# Patient Record
Sex: Male | Born: 1990 | Race: White | Hispanic: No | Marital: Single | Smoking: Current every day smoker
Health system: Southern US, Community
[De-identification: ages and names within clinical notes are randomized; demographics above are authoritative.]

## PROBLEM LIST (undated history)

## (undated) DIAGNOSIS — F209 Schizophrenia, unspecified: Secondary | ICD-10-CM

## (undated) DIAGNOSIS — F3181 Bipolar II disorder: Secondary | ICD-10-CM

---
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
## 2013-09-18 ENCOUNTER — Emergency Department (HOSPITAL_COMMUNITY)
Admission: EM | Admit: 2013-09-18 | Discharge: 2013-09-19 | Disposition: A | Discharge status: Other Acute Inpatient Hospital | Payer: Medicaid Other | Attending: Emergency Medicine | Admitting: Emergency Medicine

## 2013-09-18 ENCOUNTER — Encounter (HOSPITAL_COMMUNITY): Payer: Self-pay | Admitting: Emergency Medicine

## 2013-09-18 DIAGNOSIS — F1210 Cannabis abuse, uncomplicated: Secondary | ICD-10-CM | POA: Insufficient documentation

## 2013-09-18 DIAGNOSIS — R45851 Suicidal ideations: Secondary | ICD-10-CM | POA: Insufficient documentation

## 2013-09-18 DIAGNOSIS — R4589 Other symptoms and signs involving emotional state: Secondary | ICD-10-CM

## 2013-09-18 DIAGNOSIS — F259 Schizoaffective disorder, unspecified: Secondary | ICD-10-CM

## 2013-09-18 DIAGNOSIS — F17200 Nicotine dependence, unspecified, uncomplicated: Secondary | ICD-10-CM | POA: Insufficient documentation

## 2013-09-18 DIAGNOSIS — F23 Brief psychotic disorder: Secondary | ICD-10-CM

## 2013-09-18 DIAGNOSIS — F29 Unspecified psychosis not due to a substance or known physiological condition: Secondary | ICD-10-CM | POA: Insufficient documentation

## 2013-09-18 DIAGNOSIS — Z8659 Personal history of other mental and behavioral disorders: Secondary | ICD-10-CM | POA: Insufficient documentation

## 2013-09-18 DIAGNOSIS — IMO0002 Reserved for concepts with insufficient information to code with codable children: Secondary | ICD-10-CM | POA: Insufficient documentation

## 2013-09-18 HISTORY — DX: Bipolar II disorder: F31.81

## 2013-09-18 HISTORY — DX: Schizophrenia, unspecified: F20.9

## 2013-09-18 LAB — CBC
HEMATOCRIT: 45.9 % (ref 39.0–52.0)
Hemoglobin: 15.8 g/dL (ref 13.0–17.0)
MCH: 29.4 pg (ref 26.0–34.0)
MCHC: 34.4 g/dL (ref 30.0–36.0)
MCV: 85.5 fL (ref 78.0–100.0)
Platelets: 285 10*3/uL (ref 150–400)
RBC: 5.37 MIL/uL (ref 4.22–5.81)
RDW: 14.6 % (ref 11.5–15.5)
WBC: 9.8 10*3/uL (ref 4.0–10.5)

## 2013-09-18 LAB — COMPREHENSIVE METABOLIC PANEL
ALK PHOS: 77 U/L (ref 39–117)
ALT: 120 U/L — ABNORMAL HIGH (ref 0–53)
ANION GAP: 14 (ref 5–15)
AST: 32 U/L (ref 0–37)
Albumin: 4.2 g/dL (ref 3.5–5.2)
BUN: 12 mg/dL (ref 6–23)
CHLORIDE: 102 meq/L (ref 96–112)
CO2: 25 mEq/L (ref 19–32)
Calcium: 9.9 mg/dL (ref 8.4–10.5)
Creatinine, Ser: 1.07 mg/dL (ref 0.50–1.35)
GFR calc non Af Amer: >90 mL/min (ref >90)
GLUCOSE: 85 mg/dL (ref 70–99)
POTASSIUM: 4.4 meq/L (ref 3.7–5.3)
Sodium: 141 mEq/L (ref 137–147)
Total Bilirubin: 0.4 mg/dL (ref 0.3–1.2)
Total Protein: 7.9 g/dL (ref 6.0–8.3)

## 2013-09-18 LAB — ETHANOL: Alcohol, Ethyl (B): <11 mg/dL (ref 0–11)

## 2013-09-18 LAB — ACETAMINOPHEN LEVEL

## 2013-09-18 LAB — SALICYLATE LEVEL: Salicylate Lvl: <2 mg/dL — ABNORMAL LOW (ref 2.8–20.0)

## 2013-09-18 MED ORDER — ACETAMINOPHEN 325 MG PO TABS: 650.0000 mg | ORAL_TABLET | ORAL | Status: DC | PRN

## 2013-09-18 MED ORDER — ALUM & MAG HYDROXIDE-SIMETH 200-200-20 MG/5ML PO SUSP: 30.0000 mL | ORAL | Status: DC | PRN

## 2013-09-18 MED ORDER — ZOLPIDEM TARTRATE 5 MG PO TABS: 5.0000 mg | ORAL_TABLET | Freq: Every evening | ORAL | Status: DC | PRN

## 2013-09-18 MED ORDER — ONDANSETRON HCL 4 MG PO TABS: 4.0000 mg | ORAL_TABLET | Freq: Three times a day (TID) | ORAL | Status: DC | PRN

## 2013-09-18 MED ORDER — NICOTINE 21 MG/24HR TD PT24
21.0000 mg | MEDICATED_PATCH | Freq: Every day | TRANSDERMAL | Status: DC
  Administered 2013-09-19: 21 mg via TRANSDERMAL
  Filled 2013-09-18: qty 1

## 2013-09-18 MED ORDER — IBUPROFEN 200 MG PO TABS
600.0000 mg | ORAL_TABLET | Freq: Three times a day (TID) | ORAL | Status: DC | PRN
Start: 2013-09-18 — End: 2013-09-19
  Administered 2013-09-19: 600 mg via ORAL
  Filled 2013-09-18: qty 3

## 2013-09-18 MED ORDER — LORAZEPAM 1 MG PO TABS
1.0000 mg | ORAL_TABLET | Freq: Three times a day (TID) | ORAL | Status: DC | PRN
  Administered 2013-09-19: 1 mg via ORAL
  Filled 2013-09-18: qty 1

## 2013-09-18 NOTE — ED Notes (Signed)
Patient escorted to treatment room 42 via ambulatory with a steady gait by NT and Field seismologist.
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
## 2013-09-19 ENCOUNTER — Encounter (HOSPITAL_COMMUNITY): Payer: Self-pay | Admitting: *Deleted

## 2013-09-19 ENCOUNTER — Inpatient Hospital Stay (HOSPITAL_COMMUNITY)
Admission: AD | Admit: 2013-09-19 | Discharge: 2013-09-26 | DRG: 885 | Disposition: A | Discharge status: Home / Self Care | Payer: Federal, State, Local not specified - Other | Source: Intra-hospital | Attending: Psychiatry | Admitting: Psychiatry

## 2013-09-19 ENCOUNTER — Encounter (HOSPITAL_COMMUNITY): Payer: Self-pay | Admitting: Registered Nurse

## 2013-09-19 DIAGNOSIS — F1220 Cannabis dependence, uncomplicated: Secondary | ICD-10-CM | POA: Diagnosis present

## 2013-09-19 DIAGNOSIS — F259 Schizoaffective disorder, unspecified: Secondary | ICD-10-CM | POA: Diagnosis present

## 2013-09-19 DIAGNOSIS — Z91199 Patient's noncompliance with other medical treatment and regimen due to unspecified reason: Secondary | ICD-10-CM

## 2013-09-19 DIAGNOSIS — F411 Generalized anxiety disorder: Secondary | ICD-10-CM | POA: Diagnosis present

## 2013-09-19 DIAGNOSIS — Z9119 Patient's noncompliance with other medical treatment and regimen: Secondary | ICD-10-CM

## 2013-09-19 DIAGNOSIS — R45850 Homicidal ideations: Secondary | ICD-10-CM

## 2013-09-19 DIAGNOSIS — G4700 Insomnia, unspecified: Secondary | ICD-10-CM | POA: Diagnosis present

## 2013-09-19 DIAGNOSIS — F1210 Cannabis abuse, uncomplicated: Secondary | ICD-10-CM

## 2013-09-19 DIAGNOSIS — F3189 Other bipolar disorder: Secondary | ICD-10-CM

## 2013-09-19 DIAGNOSIS — F17200 Nicotine dependence, unspecified, uncomplicated: Secondary | ICD-10-CM | POA: Diagnosis present

## 2013-09-19 DIAGNOSIS — F312 Bipolar disorder, current episode manic severe with psychotic features: Secondary | ICD-10-CM | POA: Diagnosis present

## 2013-09-19 DIAGNOSIS — R45851 Suicidal ideations: Secondary | ICD-10-CM

## 2013-09-19 LAB — RAPID URINE DRUG SCREEN, HOSP PERFORMED
Amphetamines: NOT DETECTED
BARBITURATES: NOT DETECTED
BENZODIAZEPINES: NOT DETECTED
COCAINE: NOT DETECTED
Opiates: NOT DETECTED
Tetrahydrocannabinol: POSITIVE — AB

## 2013-09-19 MED ORDER — LORAZEPAM 2 MG/ML IJ SOLN
2.0000 mg | Freq: Once | INTRAMUSCULAR | Status: AC
  Administered 2013-09-19: 2 mg via INTRAMUSCULAR
  Filled 2013-09-19: qty 1

## 2013-09-19 MED ORDER — ZIPRASIDONE MESYLATE 20 MG IM SOLR
20.0000 mg | Freq: Once | INTRAMUSCULAR | Status: AC
  Administered 2013-09-19: 20 mg via INTRAMUSCULAR
  Filled 2013-09-19: qty 20

## 2013-09-19 MED ORDER — HALOPERIDOL LACTATE 5 MG/ML IJ SOLN: 5.0000 mg | Freq: Once | INTRAMUSCULAR | Status: DC

## 2013-09-19 MED ORDER — GABAPENTIN 400 MG PO CAPS
400.0000 mg | ORAL_CAPSULE | Freq: Every day | ORAL | Status: DC
  Administered 2013-09-19: 400 mg via ORAL
  Filled 2013-09-19 (×3): qty 1

## 2013-09-19 MED ORDER — MAGNESIUM HYDROXIDE 400 MG/5ML PO SUSP: 30.0000 mL | Freq: Every day | ORAL | Status: DC | PRN

## 2013-09-19 MED ORDER — LORAZEPAM 2 MG/ML IJ SOLN
2.0000 mg | Freq: Once | INTRAMUSCULAR | Status: AC
  Administered 2013-09-19: 2 mg via INTRAMUSCULAR

## 2013-09-19 MED ORDER — DIPHENHYDRAMINE HCL 50 MG/ML IJ SOLN
50.0000 mg | Freq: Once | INTRAMUSCULAR | Status: AC
  Administered 2013-09-19: 50 mg via INTRAMUSCULAR
  Filled 2013-09-19: qty 1

## 2013-09-19 MED ORDER — TRAZODONE HCL 50 MG PO TABS
50.0000 mg | ORAL_TABLET | Freq: Every evening | ORAL | Status: DC | PRN
  Administered 2013-09-21 – 2013-09-26 (×7): 50 mg via ORAL
  Filled 2013-09-19 (×18): qty 1

## 2013-09-19 MED ORDER — DIPHENHYDRAMINE HCL 50 MG/ML IJ SOLN
50.0000 mg | Freq: Once | INTRAMUSCULAR | Status: AC
  Administered 2013-09-19: 50 mg via INTRAMUSCULAR
  Filled 2013-09-19 (×2): qty 1

## 2013-09-19 MED ORDER — LORAZEPAM 2 MG/ML IJ SOLN
2.0000 mg | Freq: Once | INTRAMUSCULAR | Status: DC
  Filled 2013-09-19: qty 1

## 2013-09-19 NOTE — ED Notes (Signed)
Patient exiting his room at a full running speed, going across the hallway and running back as fast as he can to his room. RN explained to patient that he cannot run in the hospital for safety reasons. Pt began to curse and scream at RN. [employer] and security to room to redirect behaviors. Pt screaming "If I want to run in between you two right now, I fucking will! You are not an RN you are a fucking God-damned bitch!". Pt began to walk towards RN in a threatening manner with his finger pointed in RN's face. [employer] intervened and redirected pt's behavior. Pt continued to scream and curse security and [employer]. Pt unable to be redirected and remains angry. Dr. Ladona Ridgel in hallway and is aware of behaviors.

## 2013-09-19 NOTE — ED Notes (Signed)
Patient delusional, stating "I need to get the [facility] in here to break me out of this place. I was adopted and I have to get a genetics test to prove it, but the mother fuckers won't let me leave!" Patient becoming anxious, Ativan given PRN as ordered. No s/s of distress noted. Pt denies SI/HI or A/V hallucinations.

## 2013-09-19 NOTE — ED Notes (Signed)
Patient approaching desk multiple times asking to use the treadmill on the unit. RN informed pt that no treadmill available for patient use. Pt cursing and agitated and pacing halls. Pt instructed to return to his room. Pt eventually went to his room after much redirection.

## 2013-09-19 NOTE — ED Notes (Signed)
Patient pacing halls and walking past male patient's rooms. Pt. Redirected to his room.

## 2013-09-19 NOTE — Progress Notes (Signed)
Patient has been accepted to [facility] 406-2.   Cobos is the accepting doctor.   Writer faxed the IVC paperwork to [facility]  Writer informed the [facility] In[facility] Fermo that the paperwork has been faxed.  The nurse will make arrangements with the [facility] for transportation.

## 2013-09-19 NOTE — Consult Note (Signed)
Face to face evaluation and I agree with this note 

## 2013-09-19 NOTE — ED Notes (Signed)
Pt transported to [facility] via [employer]. No s/s of distress noted at this time. $400.00 cash in sealed security envelope given to [employer] for transport with patient with instructions to give to next RN. Belongings given to [employer] as well.

## 2013-09-19 NOTE — Consult Note (Signed)
[place] Psychiat[facility] Consult   Reason for Consult:  Odd behavior Referring Physician:  EDP  Troy Good is an 23 y.o. male. Total Time spent with patient: 45 minutes  Assessment: AXIS I:  Schizoaffective Disorder AXIS II:  Deferred AXIS III:   Past Medical History  Diagnosis Date  . Schizophrenia   . Bipolar 2 disorder    AXIS IV:  other psychosocial or environmental problems AXIS V:  21-30 behavior considerably influenced by delusions or hallucinations OR serious impairment in judgment, communication OR inability to function in almost all areas  Plan:  Recommend psychiatric Inpatient admission when medically cleared.  Subjective:   Troy Good is a 23 y.o. male patient admitted with Schizoaffective Disorder.  HPI:  Patient states that his psychiatrist is Thea Alken (Zimmerman who has passed).  Patient is not a good historian stating "I have no rights in this country.  Every time I get in trouble the police want to send me to the mental hospital.  When ever I'm doing my shenanigans; stuff I do to amuse my self like giving a fat kid one hundred dollars to pull my uncles pants down and take off running while on camera.  I don't want to hurt nobody; but I'm like a dog on a chain that is growling and the person I want to bite is just standing just above the reach of the chain. I'm like paranoid schizophrenic internalized."  Patient is disorganized but denies suicidal/homicidal ideation, psychosis, and paranoia.    HPI Elements:   Location:  Odd behavior. Quality:  Walking in street in underwear with a knife. Severity:  Walking in street in underwear with a knife. Timing:  1 day. Review of Systems  HENT: Negative.   Musculoskeletal: Negative.   Neurological: Negative for seizures.  Psychiatric/Behavioral: Negative for depression (Denies), suicidal ideas (denies), hallucinations (Denies), memory loss (Denies) and substance abuse (Denies). The patient is not  nervous/anxious (deniesDenies) and does not have insomnia (Denies).     No family history on file.  Past Psychiatric History: Past Medical History  Diagnosis Date  . Schizophrenia   . Bipolar 2 disorder     reports that he has been smoking Cigarettes.  He has been smoking about 0.00 packs per day. He does not have any smokeless tobacco history on file. He reports that he uses illicit drugs (Marijuana). He reports that he does not drink alcohol. No family history on file.         Allergies:   Allergies  Allergen Reactions  . Augmentin [Amoxicillin-Pot Clavulanate] Other (See Comments)    Pt does not like the way it makes him feel  . Risperidone And Related Other (See Comments)    Felt like wanted to party    ACT Assessment Complete:  Yes:    Educational Status    Risk to Self: Risk to self with the past 6 months Is patient at risk for suicide?: Yes Substance abuse history and/or treatment for substance abuse?: No  Risk to Others:    Abuse:    Prior Inpatient Therapy:    Prior Outpatient Therapy:    Additional Information:        Objective: Blood pressure 147/66, pulse 97, temperature 97.6 F (36.4 C), temperature source Oral, resp. rate 20, SpO2 97.00%.There is no height or weight on file to calculate BMI. Results for orders placed during the hospital encounter of 09/18/13 (from the past 72 hour(s))  ACETAMINOPHEN LEVEL  Status: None   Collection Time    09/18/13 10:48 PM      Result Value Ref Range   Acetaminophen (Tylenol), Serum <15.0  10 - 30 ug/mL   Comment:            THERAPEUTIC CONCENTRATIONS VARY     SIGNIFICANTLY. A RANGE OF 10-30     ug/mL MAY BE AN EFFECTIVE     CONCENTRATION FOR MANY PATIENTS.     HOWEVER, SOME ARE BEST TREATED     AT CONCENTRATIONS OUTSIDE THIS     RANGE.     ACETAMINOPHEN CONCENTRATIONS     >150 ug/mL AT 4 HOURS AFTER     INGESTION AND >50 ug/mL AT 12     HOURS AFTER INGESTION ARE     OFTEN ASSOCIATED WITH TOXIC      REACTIONS.  CBC     Status: None   Collection Time    09/18/13 10:48 PM      Result Value Ref Range   WBC 9.8  4.0 - 10.5 K/uL   RBC 5.37  4.22 - 5.81 MIL/uL   Hemoglobin 15.8  13.0 - 17.0 g/dL   HCT 45.9  39.0 - 52.0 %   MCV 85.5  78.0 - 100.0 fL   MCH 29.4  26.0 - 34.0 pg   MCHC 34.4  30.0 - 36.0 g/dL   RDW 14.6  11.5 - 15.5 %   Platelets 285  150 - 400 K/uL  COMPREHENSIVE METABOLIC PANEL     Status: Abnormal   Collection Time    09/18/13 10:48 PM      Result Value Ref Range   Sodium 141  137 - 147 mEq/L   Potassium 4.4  3.7 - 5.3 mEq/L   Chloride 102  96 - 112 mEq/L   CO2 25  19 - 32 mEq/L   Glucose, Bld 85  70 - 99 mg/dL   BUN 12  6 - 23 mg/dL   Creatinine, Ser 1.07  0.50 - 1.35 mg/dL   Calcium 9.9  8.4 - 10.5 mg/dL   Total Protein 7.9  6.0 - 8.3 g/dL   Albumin 4.2  3.5 - 5.2 g/dL   AST 32  0 - 37 U/L   ALT 120 (*) 0 - 53 U/L   Alkaline Phosphatase 77  39 - 117 U/L   Total Bilirubin 0.4  0.3 - 1.2 mg/dL   GFR calc non Af Amer >90  >90 mL/min   GFR calc Af Amer >90  >90 mL/min   Comment: (NOTE)     The eGFR has been calculated using the CKD EPI equation.     This calculation has not been validated in all clinical situations.     eGFR's persistently <90 mL/min signify possible Chronic Kidney     Disease.   Anion gap 14  5 - 15  ETHANOL     Status: None   Collection Time    09/18/13 10:48 PM      Result Value Ref Range   Alcohol, Ethyl (B) <11  0 - 11 mg/dL   Comment:            LOWEST DETECTABLE LIMIT FOR     SERUM ALCOHOL IS 11 mg/dL     FOR MEDICAL PURPOSES ONLY  SALICYLATE LEVEL     Status: Abnormal   Collection Time    09/18/13 10:48 PM      Result Value Ref Range   Salicylate Lvl <6.1 (*) 2.8 -  20.0 mg/dL   Labs are reviewed UDS specimen need to be collected  See other values above; Medications reviewed and no changes.  Current Facility-Administered Medications  Medication Dose Route Frequency Provider Last Rate Last Dose  . acetaminophen (TYLENOL)  tablet 650 mg  650 mg Oral Q4H PRN Antonietta Breach, PA-C      . alum & mag hydroxide-simeth (MAALOX/MYLANTA) 200-200-20 MG/5ML suspension 30 mL  30 mL Oral PRN Antonietta Breach, PA-C      . ibuprofen (ADVIL,MOTRIN) tablet 600 mg  600 mg Oral Q8H PRN Antonietta Breach, PA-C   600 mg at 09/19/13 0942  . LORazepam (ATIVAN) tablet 1 mg  1 mg Oral Q8H PRN Antonietta Breach, PA-C   1 mg at 09/19/13 0942  . nicotine (NICODERM CQ - dosed in mg/24 hours) patch 21 mg  21 mg Transdermal Daily Antonietta Breach, PA-C      . ondansetron [facility]) tablet 4 mg  4 mg Oral Q8H PRN Antonietta Breach, PA-C      . zolpidem (AMBIEN) tablet 5 mg  5 mg Oral QHS PRN Antonietta Breach, PA-C       No current outpatient prescriptions on file.    Psychiatric Specialty Exam:     Blood pressure 147/66, pulse 97, temperature 97.6 F (36.4 C), temperature source Oral, resp. rate 20, SpO2 97.00%.There is no height or weight on file to calculate BMI.  General Appearance: Bizarre and Disheveled  Eye Contact::  Good  Speech:  Clear and Coherent and Normal Rate  Volume:  Normal  Mood:  Euphoric  Affect:  Full Range  Thought Process:  Irrelevant and Loose  Orientation:  Full (Time, Place, and Person)  Thought Content:  Rumination  Suicidal Thoughts:  No  Homicidal Thoughts:  No  Memory:  Immediate;   Fair Recent;   Fair Remote;   Fair  Judgement:  Impaired  Insight:  Fair  Psychomotor Activity:  Normal  Concentration:  Fair  Recall:  [employer] [place]  Language: Fair  Akathisia:  No  Handed:  Right  AIMS (if indicated):     Assets:  Communication Skills Desire for Improvement Housing Social Support  Sleep:      Musculoskeletal: Strength & Muscle Tone: within normal limits Gait & Station: normal Patient leans: N/A  Treatment Plan Summary: Daily contact with patient to assess and evaluate symptoms and progress in treatment Medication management Inpatient treatment recommended  Earleen Newport, FNP-BC 09/19/2013 11:25 AM

## 2013-09-19 NOTE — ED Notes (Signed)
Patient took injection without resistance. Respirations equal and unlabored, skin warm and dry. No acute distress noted. Encouragement and support provided and safety maintain.

## 2013-09-19 NOTE — Progress Notes (Signed)
Per Dr. Ladona Ridgel patient meets criteria for inpatient hospitalization.  Dr. Ladona Ridgel completed the first oponion.   Writer will seek placement.

## 2013-09-19 NOTE — ED Notes (Signed)
Patient resting quietly in bed with his eyes closed. Respirations equal and unlabored. Skin warm and dry, No acute distress noted. Safety maintain.

## 2013-09-19 NOTE — ED Notes (Signed)
Patient laying down on floor in the door way. Patient refuses to get in his bed. Patient calls his room a jail cell. Writer tried to explain to patient that his room was not a jail cell. Encouragement and support provided.

## 2013-09-19 NOTE — ED Notes (Signed)
Patient was observed by Clinical research associate putting a chir in his bed and the beds side table over his face as patient laid in the bed. When I went and asked patient was he ok and if there is anything I could do He said ' my leg hurts and don't nobody give a shit about it" I explained to him that I do. I told him that I would talk to his nurse and see if he could have anything for the pain he said ' If you do not give me klonopin, xanax, neurotin I am not taking it, I will not take anything else

## 2013-09-19 NOTE — Progress Notes (Signed)
Patient ID: Troy Good, male   DOB: [birth year], 23 y.o.   MRN: [id number] Patient admitted to [address].  Uncooperative with staff on admission. Refused to sign documents. Became angry while reading papers and began to pound fist on table "I'm going to sue them for putting me here, I want a blood relative one of my biological parents to sign these papers.  Why are you writing on them, are you my blood relative."  Unable to complete assessment due to patient's uncooperative behavior.  Denies pain, no signs of distress.  Respirations even and unlabored.  Denied SI and HI.  Denied A/V hallucinations.  Patient is extremely disorganized.  After patient came on the unit.  No roommate order provided because of patient's verbal aggression and potential for physical aggression.  Patient was laying on floor of dayroom with his head under the chair.  Patient later told MHT that his cat was stole and he wanted his cat back.   Fall risk information given and discussed with patient, low fall risk. Food and drink given patient.  Patient oriented to unit.  Patient is a potential elopement risk. [address] has hat, plastic bag and $400 was locked up in the safe.

## 2013-09-19 NOTE — Progress Notes (Signed)
Dr. Ladona Ridgel completed the first opinion.  Writer gave the IVC paperwork to the nurse working with the patient.

## 2013-09-19 NOTE — ED Provider Notes (Signed)
Medical screening examination/treatment/procedure(s) were performed by non-physician practitioner and as supervising physician I was immediately available for consultation/collaboration.   EKG Interpretation None      Devoria Albe, MD, Armando Gang   Ward Givens, MD 09/19/13 1501

## 2013-09-19 NOTE — ED Notes (Signed)
Pt asking to use the phone. Pt provided phone and RN asked if he needed assistance in dialing. Pt states "I know how to use the fucking phone". Pt remains labile and is agitated with redirection.

## 2013-09-20 DIAGNOSIS — R45850 Homicidal ideations: Secondary | ICD-10-CM

## 2013-09-20 MED ORDER — ENSURE COMPLETE PO LIQD
237.0000 mL | Freq: Two times a day (BID) | ORAL | Status: DC
  Administered 2013-09-20 – 2013-09-23 (×6): 237 mL via ORAL

## 2013-09-20 MED ORDER — LORAZEPAM 2 MG/ML IJ SOLN: 2.0000 mg | Freq: Two times a day (BID) | INTRAMUSCULAR | Status: DC | PRN

## 2013-09-20 MED ORDER — OLANZAPINE 10 MG PO TBDP
10.0000 mg | ORAL_TABLET | Freq: Two times a day (BID) | ORAL | Status: DC | PRN
  Administered 2013-09-20: 10 mg via ORAL
  Filled 2013-09-20: qty 1

## 2013-09-20 MED ORDER — BENZTROPINE MESYLATE 0.5 MG PO TABS
0.5000 mg | ORAL_TABLET | Freq: Two times a day (BID) | ORAL | Status: DC
  Filled 2013-09-20 (×4): qty 1

## 2013-09-20 MED ORDER — ZIPRASIDONE HCL 20 MG PO CAPS
20.0000 mg | ORAL_CAPSULE | Freq: Four times a day (QID) | ORAL | Status: DC | PRN
  Administered 2013-09-20: 20 mg via ORAL
  Filled 2013-09-20: qty 1

## 2013-09-20 MED ORDER — ARIPIPRAZOLE 10 MG PO TABS
10.0000 mg | ORAL_TABLET | Freq: Two times a day (BID) | ORAL | Status: DC
  Administered 2013-09-20 – 2013-09-26 (×12): 10 mg via ORAL
  Filled 2013-09-20 (×14): qty 1

## 2013-09-20 MED ORDER — OLANZAPINE 5 MG PO TBDP
5.0000 mg | ORAL_TABLET | Freq: Every evening | ORAL | Status: DC
  Filled 2013-09-20: qty 1

## 2013-09-20 MED ORDER — LORAZEPAM 2 MG/ML IJ SOLN: 1.0000 mg | Freq: Four times a day (QID) | INTRAMUSCULAR | Status: DC | PRN

## 2013-09-20 MED ORDER — GABAPENTIN 400 MG PO CAPS: 400.0000 mg | ORAL_CAPSULE | Freq: Two times a day (BID) | ORAL | Status: DC

## 2013-09-20 MED ORDER — LORAZEPAM 1 MG PO TABS
1.0000 mg | ORAL_TABLET | Freq: Four times a day (QID) | ORAL | Status: DC | PRN
  Administered 2013-09-20 – 2013-09-25 (×5): 1 mg via ORAL
  Filled 2013-09-20 (×5): qty 1

## 2013-09-20 MED ORDER — OLANZAPINE 10 MG IM SOLR: 10.0000 mg | Freq: Two times a day (BID) | INTRAMUSCULAR | Status: DC | PRN

## 2013-09-20 MED ORDER — GABAPENTIN 400 MG PO CAPS
400.0000 mg | ORAL_CAPSULE | Freq: Two times a day (BID) | ORAL | Status: DC
  Administered 2013-09-20 – 2013-09-26 (×13): 400 mg via ORAL
  Filled 2013-09-20 (×16): qty 1

## 2013-09-20 MED ORDER — BENZTROPINE MESYLATE 0.5 MG PO TABS
0.5000 mg | ORAL_TABLET | Freq: Four times a day (QID) | ORAL | Status: DC | PRN
  Administered 2013-09-20 – 2013-09-23 (×2): 0.5 mg via ORAL
  Filled 2013-09-20 (×2): qty 1

## 2013-09-20 MED ORDER — DIVALPROEX SODIUM 500 MG PO DR TAB
500.0000 mg | DELAYED_RELEASE_TABLET | Freq: Two times a day (BID) | ORAL | Status: DC
  Administered 2013-09-20 – 2013-09-21 (×3): 500 mg via ORAL
  Filled 2013-09-20 (×6): qty 1

## 2013-09-20 MED ORDER — LORAZEPAM 1 MG PO TABS
2.0000 mg | ORAL_TABLET | Freq: Two times a day (BID) | ORAL | Status: DC | PRN
  Administered 2013-09-20: 2 mg via ORAL
  Filled 2013-09-20: qty 2

## 2013-09-20 MED ORDER — HYDROXYZINE HCL 50 MG PO TABS
50.0000 mg | ORAL_TABLET | ORAL | Status: DC | PRN
  Administered 2013-09-20 – 2013-09-26 (×4): 50 mg via ORAL
  Filled 2013-09-20 (×4): qty 1

## 2013-09-20 MED ORDER — NICOTINE POLACRILEX 2 MG MT GUM
2.0000 mg | CHEWING_GUM | OROMUCOSAL | Status: DC | PRN
  Administered 2013-09-20 – 2013-09-26 (×3): 2 mg via ORAL
  Filled 2013-09-20 (×3): qty 1

## 2013-09-20 MED ORDER — OLANZAPINE 10 MG PO TBDP
10.0000 mg | ORAL_TABLET | Freq: Two times a day (BID) | ORAL | Status: DC
  Filled 2013-09-20 (×2): qty 1

## 2013-09-20 MED ORDER — ZIPRASIDONE MESYLATE 20 MG IM SOLR: 20.0000 mg | Freq: Four times a day (QID) | INTRAMUSCULAR | Status: DC | PRN

## 2013-09-20 MED ORDER — BENZTROPINE MESYLATE 1 MG/ML IJ SOLN: 0.5000 mg | Freq: Four times a day (QID) | INTRAMUSCULAR | Status: DC | PRN

## 2013-09-20 MED ORDER — ACETAMINOPHEN 325 MG PO TABS
650.0000 mg | ORAL_TABLET | Freq: Four times a day (QID) | ORAL | Status: DC | PRN
  Administered 2013-09-24 – 2013-09-26 (×3): 650 mg via ORAL
  Filled 2013-09-20 (×3): qty 2

## 2013-09-20 NOTE — Clinical Social Work Note (Signed)
CSW attempted to meet with pt. Pt staring but not verbally responding when asked to meet with CSW. CSW will attempt to meet with pt in the AM. Pt appeared agitated and resistant during interaction.   [employer], LCSWA 09/20/2013 3:02 PM

## 2013-09-20 NOTE — Progress Notes (Signed)
NUTRITION ASSESSMENT  Pt identified as at risk on the Malnutrition Screen Tool  INTERVENTION: Ensure Complete BID  NUTRITION DIAGNOSIS: Predicted suboptimal energy intake r/t pt not liking the food AEB pt report.   Goal: Pt to meet >/= 90% of their estimated nutrition needs.  Monitor:  PO intake  Assessment:  Pt with hx of schizoaffective disorder vs bipolar do ,who presented to [facility] IVCed by his step mother for bizzare behavior, threatening, [place].  - Pt uncooperative with nutrition questions - York Spaniel that the food we were giving him was "bullshit" and that he didn't get the kind of meat or peanut butter he wanted - He said that the food wasn't that great but if he was given marijuana he would give the food a 5 star rating - Very agitated during conversation   23 y.o. male  Height: Ht Readings from Last 1 Encounters:  09/19/13 5' 7.5" (1.715 m)    Weight: Wt Readings from Last 1 Encounters:  09/19/13 175 lb (79.379 kg)    Weight Hx: Wt Readings from Last 10 Encounters:  09/19/13 175 lb (79.379 kg)  06/25/10 178 lb (80.74 kg) (82%*, Z = 0.93)  09/11/09 188 lb 8 oz (85.503 kg) (91%*, Z = 1.32)  08/08/09 188 lb (85.276 kg) (91%*, Z = 1.32)  11/01/08 187 lb (84.823 kg) (92%*, Z = 1.43)  04/30/08 186 lb 14.4 oz (84.777 kg) (94%*, Z = 1.53)  03/23/08 191 lb 6.4 oz (86.818 kg) (95%*, Z = 1.66)  11/25/07 178 lb 11.2 oz (81.058 kg) (92%*, Z = 1.43)  11/03/07 180 lb 11.2 oz (81.965 kg) (93%*, Z = 1.50)  10/04/07 176 lb 3.2 oz (79.924 kg) (92%*, Z = 1.41)   * Growth percentiles are based on CDC 2-20 Years data.    BMI:  Body mass index is 26.99 kg/(m^2). Pt meets criteria for overweight based on current BMI.  Estimated Nutritional Needs: Kcal: 25-30 kcal/kg Protein: > 1 gram protein/kg Fluid: 1 ml/kcal  Diet Order: General Pt is also offered choice of unit snacks mid-morning and mid-afternoon.  Pt is eating as desired.   Lab results and  medications reviewed. ALT elevated.   Charlott Rakes MS, RD, LDN [phone] Pager [phone] Weekend/After Hours Pager

## 2013-09-20 NOTE — Progress Notes (Signed)
Patient ID: Troy Good, male   DOB: February 12, 1990, 23 y.o.   MRN: [id number]  Per Magda Bernheim VP of [facility]; Dr. Lucianne Muss spoke with [name] and stated that since this patient in IVC, forced medications are able to be given to patient if patient poses threat to self or others and refuses PO medications. As of right now patient is taking oral medications. Staff is aware of this.

## 2013-09-20 NOTE — Progress Notes (Signed)
The focus of this group is to help patients review their daily goal of treatment and discuss progress on daily workbooks. Pt attended the evening group session and responded to all discussion prompts from the Writer. Pt told the group he had a good day because he heard someone playing music (piano and guitar) in the dayroom during an earlier group and because he was able to play basketball in the gym. Pt then became upset and told the group that upon discharge he planned to get as far away from here as possible and change his appearance. "I can become anyone, even a Jewish guy or a black guy. You just watch." Pt next said the hospital was implanting microchips into his body with each shot. Pt then calmed down on his own to discuss his basketball skills. Pt's affect was flat, then agitated, then flat again.

## 2013-09-20 NOTE — Progress Notes (Signed)
Patient ID: ILIJAH DOUCET, male   DOB: 1990-06-23, 23 y.o.   MRN: [id number]  D: Pt. Denies SI and A/V Hallucinations. Patient has HI but it is not directed towards a particular person at this time. Patient stated, "don't give me Ritalin or I will kill someone!" Patient does not report any pain at this time.  A: Support and encouragement provided to the patient to remain calm and speak with staff about any needs or concerns. Scheduled medications administered to patient and PRN medication given to patient to decrease his agitation and anxiety. R: Patient is disruptive this morning and verbally aggressive. Patient is witnessed by staff beating on the walls and threatening to break a window. Patient is seen yelling and screaming, "take me to jail!!!!" Patient calmed down after being given medication and also given support and reinforcement from the Neurosurgeon, Clinical research associate, and staff. Later in the day patient was seen walking in the halls and reported he feels "depressed." Patient is calm and cooperative at this time. Patient attended afternoon group. Q15 minute checks are maintained for safety.

## 2013-09-20 NOTE — H&P (Addendum)
Psychiatric Admission Assessment Adult  Patient Identification:  Troy Good Date of Evaluation:  09/20/2013 Chief Complaint: " The government has me listed as a [place] person"   History of Present Illness::Troy Good is a 23 year old CM ,single ,employed with hx of schizoaffective disorder vs bipolar do ,who presented to [facility] by his step mother Chris Cripps for bizzare behavior,threatening ,wandering the interstate. Patient after [facility] was agitated ,loud and aggressive ,banging on doors ,yelling ,hostile ,irritable ,climbing the shelves in his room. Patient appears to be bizarre ,has napkins made of paper wrapped around his hand ,reports that it takes away his power so that he does not hurt anybody. Patient was hard to calm down ,yelling for ativan only and reporting that he wants ativan 5 mg only and nothing else. Reports he is allergic to every other medications including haldol,risperdal,benadryl (makes him to have a fog) (hallucinate).  Patient is very disorganized making statements like he is adopted and is from [facility] and that he has a bodyguard who is not protecting him any more and that he has HI towards Maxamillion Banas who is his adopted father and that he has a plan to kill him by recruiting people to smash his car. Patient endorses ah of hearing music. He also appears to be paranoid ,refusing medications ,refusing food and drink. Patient has been very disruptive on the unit needing verbal de-escalation and also staff who closely monitor him closely.  Collateral information was obtained from Tommi Rumps at [phone] - who reported that patients problems with mental issues started recently ,2 weeks ago when he was admitted to [facility]. Patient at that time was with his girlfriend in [place] when he started walking around the streets naked,patient also went to a store where he asked for a cigar and ended up destroying store property. Patient has  several charges against him for the same. Patient was admitted to [facility] and was diagnosed with bipolar do and was given medications. He was not compliant with medications and started acting crazy again. Patient was [place] the interstate, police found him and he was brought home to his parents ,but he started acting strange ,pulling trash aound and hence his step mom called law enforcement and took out paper on him.   Elements:  Location:  Psychosis,hyperactive. Quality:  AH of hearing music ,delusional that his dad is jealous and wants his own sons to get better and that he is adopted (but he is not),hyperactive. Severity:  severe. Timing:  constant. Duration:  regular. Context:  recent diagnosis of bipolar disorder,noncompliant on meds. Associated Signs/Synptoms:  (Hypo) Manic Symptoms:  Delusions, Distractibility, Elevated Mood, Flight of Ideas, Grandiosity, Hallucinations, Impulsivity, Irritable Mood, Labiality of Mood,  Psychotic Symptoms:  Delusions, Hallucinations: Auditory Paranoia, PTSD Symptoms: Negative Total Time spent with patient: 1 hour  Psychiatric Specialty Exam: Physical Exam  Constitutional: He is oriented to person, place, and time. He appears well-developed and well-nourished.  HENT:  Head: Normocephalic and atraumatic.  Neurological: He is alert and oriented to person, place, and time.  Psychiatric: His mood appears anxious. His affect is angry, labile and inappropriate. His speech is rapid and/or pressured. He is agitated, aggressive, hyperactive, actively hallucinating and combative. Thought content is paranoid. Cognition and memory are normal. He expresses impulsivity and inappropriate judgment. He expresses homicidal ideation. He is inattentive.    Review of Systems  Unable to perform ROS: acuity of condition    Blood pressure 164/81,  pulse 91, temperature 98.6 F (37 C), temperature source Oral, resp. rate 18, height 5' 7.5" (1.715 m),  weight 79.379 kg (175 lb).Body mass index is 26.99 kg/(m^2).  General Appearance: Bizarre and Disheveled  Eye Contact::  Minimal  Speech:  Pressured  Volume:  Increased  Mood:  Angry, Euphoric, Euthymic and Irritable  Affect:  Inappropriate and Labile  Thought Process:  Disorganized, Irrelevant and Tangential  Orientation:  Full (Time, Place, and Person)  Thought Content:  Delusions, Hallucinations: Auditory and Paranoid Ideation  Suicidal Thoughts:  No  Homicidal Thoughts:  Yes.  with intent/plan  Memory:  Immediate;   Fair Recent;   Fair Remote;   Fair  Judgement:  Impaired  Insight:  Lacking  Psychomotor Activity:  Increased and Restlessness  Concentration:  Poor  Recall:  Poor  Fund of Knowledge:Poor  Language: Fair  Akathisia:  No    AIMS (if indicated):   0  Assets:  Others:  access to health care  Sleep:  Number of Hours: 5    Musculoskeletal: Strength & Muscle Tone: within normal limits Gait & Station: normal Patient leans: N/A  Past Psychiatric History: Diagnosis:bipolar disorder  Hospitalizations:[facility] 2 weeks ago  Outpatient Care:[facility]  Substance Abuse Care:denies  Self-Mutilation:denies  Suicidal Attempts:denies  Violent Behaviors:yes -has several charges for destroying property   Past Medical History:   Past Medical History  Diagnosis Date  . Schizophrenia   . Bipolar 2 disorder    None. Allergies:   Allergies  Allergen Reactions  . Augmentin [Amoxicillin-Pot Clavulanate] Other (See Comments)    Pt does not like the way it makes him feel  . Benadryl [Diphenhydramine Hcl]   . Haldol [Haloperidol]   . Risperidone And Related Other (See Comments)    Felt like wanted to party   PTA Medications: No prescriptions prior to admission    Previous Psychotropic Medications:  Medication/Dose  Latuda,haldol (allergy),rispersdal(allery),Benadryl (foggy),depakote (noncompliant)               Substance Abuse History in the last 12  months:  Yes.    Consequences of Substance Abuse: Legal Consequences:  has several charges ,currently on probation for stealing  Social History:  reports that he has been smoking Cigarettes.  He has been smoking about 0.00 packs per day. He does not have any smokeless tobacco history on file. He reports that he uses illicit drugs (Marijuana). He reports that he does not drink alcohol. Additional Social History: Current Place of Residence:[place] Family Members:has dad ,step mom ,his biological mom is in [place],he was raised by dad and step mom since the age of 67. Marital Status:  Single,has a girlfriend Children:5 year old Relationships:struggles Education:  College-dropped out due to charges for stealing which he had to face . So came back from [place] where he was in college to [employer] and currently on probation for 8 more months. 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 Legal History:yes has several charges for destroying property recently ,also is on probation Hobbies/Interests:unknown  Family History:  No family history on file.  Results for orders placed during the hospital encounter of 09/18/13 (from the past 72 hour(s))  ACETAMINOPHEN LEVEL     Status: None   Collection Time    09/18/13 10:48 PM      Result Value Ref Range   Acetaminophen (Tylenol), Serum <15.0  10 - 30 ug/mL   Comment:  THERAPEUTIC CONCENTRATIONS VARY     SIGNIFICANTLY. A RANGE OF 10-30     ug/mL MAY BE AN EFFECTIVE     CONCENTRATION FOR MANY PATIENTS.     HOWEVER, SOME ARE BEST TREATED     AT CONCENTRATIONS OUTSIDE THIS     RANGE.     ACETAMINOPHEN CONCENTRATIONS     >150 ug/mL AT 4 HOURS AFTER     INGESTION AND >50 ug/mL AT 12     HOURS AFTER INGESTION ARE     OFTEN ASSOCIATED WITH TOXIC     REACTIONS.   CBC     Status: None   Collection Time    09/18/13 10:48 PM      Result Value Ref Range   WBC 9.8  4.0 - 10.5 K/uL   RBC 5.37  4.22 - 5.81 MIL/uL   Hemoglobin 15.8  13.0 - 17.0 g/dL   HCT 45.9  39.0 - 52.0 %   MCV 85.5  78.0 - 100.0 fL   MCH 29.4  26.0 - 34.0 pg   MCHC 34.4  30.0 - 36.0 g/dL   RDW 14.6  11.5 - 15.5 %   Platelets 285  150 - 400 K/uL  COMPREHENSIVE METABOLIC PANEL     Status: Abnormal   Collection Time    09/18/13 10:48 PM      Result Value Ref Range   Sodium 141  137 - 147 mEq/L   Potassium 4.4  3.7 - 5.3 mEq/L   Chloride 102  96 - 112 mEq/L   CO2 25  19 - 32 mEq/L   Glucose, Bld 85  70 - 99 mg/dL   BUN 12  6 - 23 mg/dL   Creatinine, Ser 1.07  0.50 - 1.35 mg/dL   Calcium 9.9  8.4 - 10.5 mg/dL   Total Protein 7.9  6.0 - 8.3 g/dL   Albumin 4.2  3.5 - 5.2 g/dL   AST 32  0 - 37 U/L   ALT 120 (*) 0 - 53 U/L   Alkaline Phosphatase 77  39 - 117 U/L   Total Bilirubin 0.4  0.3 - 1.2 mg/dL   GFR calc non Af Amer >90  >90 mL/min   GFR calc Af Amer >90  >90 mL/min   Comment: (NOTE)     The eGFR has been calculated using the CKD EPI equation.     This calculation has not been validated in all clinical situations.     eGFR's persistently <90 mL/min signify possible Chronic Kidney     Disease.   Anion gap 14  5 - 15  ETHANOL     Status: None   Collection Time    09/18/13 10:48 PM      Result Value Ref Range   Alcohol, Ethyl (B) <11  0 - 11 mg/dL   Comment:            LOWEST DETECTABLE LIMIT FOR     SERUM ALCOHOL IS 11 mg/dL     FOR MEDICAL PURPOSES ONLY  SALICYLATE LEVEL     Status: Abnormal   Collection Time    09/18/13 10:48 PM      Result Value Ref Range   Salicylate Lvl <5.6 (*) 2.8 - 20.0 mg/dL  URINE RAPID DRUG SCREEN (HOSP PERFORMED)     Status: Abnormal   Collection Time    09/19/13  1:06 PM      Result Value Ref Range   Opiates NONE DETECTED  NONE DETECTED  Cocaine NONE DETECTED  NONE DETECTED   Benzodiazepines NONE DETECTED  NONE DETECTED    Amphetamines NONE DETECTED  NONE DETECTED   Tetrahydrocannabinol POSITIVE (*) NONE DETECTED   Barbiturates NONE DETECTED  NONE DETECTED   Comment:            DRUG SCREEN FOR MEDICAL PURPOSES     ONLY.  IF CONFIRMATION IS NEEDED     FOR ANY PURPOSE, NOTIFY LAB     WITHIN 5 DAYS.                LOWEST DETECTABLE LIMITS     FOR URINE DRUG SCREEN     Drug Class       Cutoff (ng/mL)     Amphetamine      1000     Barbiturate      200     Benzodiazepine   811     Tricyclics       572     Opiates          300     Cocaine          300     THC              50   Psychological Evaluations:  Assessment:   DSM5:  Primary psychiatric diagnosis: Bipolar disorder ,type I manic , severe ,with psychosis,multiple episodes ,currently in acute episode  Secondary psychiatric diagnosis: Non compliance with medical treatment Cannabis use disorder  Non psychiatric diagnosis: None      Past Medical History  Diagnosis Date  . Schizophrenia   . Bipolar 2 disorder        Treatment Plan/Recommendations:   Patient will benefit from inpatient treatment and stabilization.  Estimated length of stay is 5-7 days.  Reviewed past medical records,treatment plan.  Will continue to monitor vitals ,medication compliance and treatment side effects while patient is here.  Will monitor for medical issues as well as call consult as needed.  Reviewed labs ,will order as needed.  CSW will start working on disposition.  Patient to participate in therapeutic milieu .   Patient is currently very psychotic ,aggressive ,has hx of violence ,medication seeking. Patient will be started on prn's for agitation. Patient will be started on abilify po with long term plans to DC him on Abilify Mainetena 2/2 compliance issues (per parents) Will restart Depakote 500 mg po bid for mood lability. Will restart Trazodone 50 mg po qhs for sleep.  Patient will be closely observed on the unit.  Will restart gabapentin 400 mg  po bid for pain.      Treatment Plan Summary: Daily contact with patient to assess and evaluate symptoms and progress in treatment Medication management Current Medications:  Current Facility-Administered Medications  Medication Dose Route Frequency Provider Last Rate Last Dose  . acetaminophen (TYLENOL) tablet 650 mg  650 mg Oral Q6H PRN Ivan Maskell, MD      . divalproex (DEPAKOTE) DR tablet 500 mg  500 mg Oral Q12H Cana Mignano, MD   500 mg at 09/20/13 1027  . gabapentin (NEURONTIN) capsule 400 mg  400 mg Oral BID Ursula Alert, MD   400 mg at 09/20/13 1027  . hydrOXYzine (ATARAX/VISTARIL) tablet 50 mg  50 mg Oral Q4H PRN Ursula Alert, MD      . LORazepam (ATIVAN) tablet 2 mg  2 mg Oral BID PRN Ursula Alert, MD   2 mg at 09/20/13 6203   Or  . LORazepam (ATIVAN) injection  2 mg  2 mg Intramuscular BID PRN Ursula Alert, MD      . magnesium hydroxide (MILK OF MAGNESIA) suspension 30 mL  30 mL Oral Daily PRN Shuvon Rankin, NP      . OLANZapine zydis (ZYPREXA) disintegrating tablet 10 mg  10 mg Oral BID PRN Ursula Alert, MD   10 mg at 09/20/13 0839   Or  . OLANZapine (ZYPREXA) injection 10 mg  10 mg Intramuscular BID PRN Juanmanuel Marohl, MD      . OLANZapine zydis (ZYPREXA) disintegrating tablet 5 mg  5 mg Oral QPM Cerita Rabelo, MD      . traZODone (DESYREL) tablet 50 mg  50 mg Oral QHS,MR X 1 Laverle Hobby, PA-C        Observation Level/Precautions:  Continuous Observation  Laboratory:  reviewed labs.  Psychotherapy:    Medications:    Consultations:    Discharge Concerns:    Estimated LOS:  Other:     I certify that inpatient services furnished can reasonably be expected to improve the patient's condition.   Jermery Caratachea 9/2/201510:40 AM

## 2013-09-20 NOTE — B[facility] Suicide Risk Assessment (Signed)
   Nursing information obtained from:    Demographic factors:    Current Mental Status:    Loss Factors:    Historical Factors:    Risk Reduction Factors:    Total Time spent with patient: 20 minutes  CLINICAL FACTORS:   Alcohol/Substance Abuse/Dependencies Currently Psychotic Unstable or Poor Therapeutic Relationship Previous Psychiatric Diagnoses and Treatments  Psychiatric Specialty Exam: Physical Exam  Constitutional: He appears well-developed and well-nourished.  Psychiatric: His mood appears anxious. His affect is angry, labile and inappropriate. His speech is tangential. He is agitated, aggressive, hyperactive, actively hallucinating and combative. Thought content is paranoid and delusional. Cognition and memory are normal. He expresses impulsivity and inappropriate judgment. He is inattentive.    Review of Systems  Unable to perform ROS: acuity of condition    Blood pressure 164/81, pulse 91, temperature 98.6 F (37 C), temperature source Oral, resp. rate 18, height 5' 7.5" (1.715 m), weight 79.379 kg (175 lb).Body mass index is 26.99 kg/(m^2).  General Appearance: Casual  Eye Contact::  Poor  Speech:  Pressured  Volume:  Increased  Mood:  Angry, Anxious and Irritable  Affect:  Inappropriate and Labile  Thought Process:  Disorganized, Irrelevant, Loose and Tangential  Orientation:  Full (Time, Place, and Person)  Thought Content:  Delusions, Hallucinations: Auditory and Paranoid Ideation  Suicidal Thoughts:  No  Homicidal Thoughts:  Yes.  with intent/plan  Memory:  Immediate;   Fair Recent;   Fair Remote;   Fair  Judgement:  Impaired  Insight:  Lacking  Psychomotor Activity:  Increased and Restlessness  Concentration:  Poor  Recall:  Poor  Fund of Knowledge:Poor  Language: Fair  Akathisia:  No    AIMS (if indicated):   0  Assets:  Others:  access to health care  Sleep:  Number of Hours: 5   Musculoskeletal: Strength & Muscle Tone: within normal  limits Gait & Station: normal Patient leans: N/A  COGNITIVE FEATURES THAT CONTRIBUTE TO RISK:  Closed-mindedness Polarized thinking Thought constriction (tunnel vision)    SUICIDE RISK:   Minimal: No identifiable suicidal ideation.  Patients presenting with no risk factors but with morbid ruminations; may be classified as minimal risk based on the severity of the depressive symptoms  Homicide risk: Moderate  PLAN OF CARE:  I certify that inpatient services furnished can reasonably be expected to improve the patient's condition.  Troy Good 09/20/2013, 10:35 AM

## 2013-09-20 NOTE — Tx Team (Signed)
Interdisciplinary Treatment Plan Update (Adult)   Date: 09/20/2013   Time Reviewed: 9:48 AM  Progress in Treatment:  Attending groups: No.  Participating in groups:  No.  Taking medication as prescribed: Yes  Tolerating medication: Yes  Family/Significant othe contact made: Not yet. SPE required/collateral information needed, as pt is poor historian.  Troy Good understands diagnosis: No. Pt disorganized, agitated, and verbally aggressive with staff. Poor historian.  Discussing Troy Good identified problems/goals with staff: Yes  Medical problems stabilized or resolved: Yes  Denies suicidal/homicidal ideation: Yes. Pt verbally aggressive and refusing to meet with  Troy Good has not harmed self or Others: Yes  New problem(s) identified: Pt verbally aggressive and is risk for physical agression. MD ordered medication and pt is on 1:1 currently. Pt is poor historian and is refusing to speak with staff at this time. CSW assessing and will attempt to meet with pt later this afternoon if pt mood is stable.  Discharge Plan or Barriers: CSW assessing. Unable to complete PSA at this time due to pt agitation, mood lability, and refusal to talk to staff regarding admission/personal info. Pt is poor historian and demonstrates disorganized thinking at this time.  Additional comments: Pt brought in by police in handcuffs under IVC Pt wearing only underware Paperwork states that pt is bipolar and schizophrenic Pt has been prescribed depakote, lituda, and trazadone but refuses to take the medications Pt is drawing on himself with markers Pt was found today wandering along the road and stated he was going to [place] When pt was found the pt was holding a knife against his stomach Pt made statement that he did not want to live and wanted to end his life Pt has been committed in the past at [facility] facility and was released on Aug 20th  Reason for Continuation of Hospitalization: Mood stabilization Med management   Psychosis (disorganized; bizarre behavior) Estimated length of stay: 5-7 days  For review of initial/current Troy Good goals, please see plan of care.  Attendees:  Troy Good:    Family:    Physician: Dr. Elna Breslow MD 09/20/2013 9:48 AM   Nursing: Aniceto Boss RN 09/20/2013 9:48 AM   Clinical Social Worker Kaveri Perras Smart, LCSWA  09/20/2013. 9:48 AM   Other: Daryel Gerald LCSW 09/20/2013 9:48 AM   Other: Charleston Ropes, CSW Intern 09/20/2013 9:48 AM   Other:     Other:    Scribe for Treatment Team:  [employer] LCSWA  09/20/2013 9:48 AM

## 2013-09-20 NOTE — Progress Notes (Addendum)
D: Pt presents blunted in affect and depressed in mood. Pt denies any SI/HI/AVH. Pt voices a strong urge to use marijuana. Pt aware that he can not do so at this time. Pt present for group this evening. Pt is calm and cooperative at this current time. Pt was receptive to taking his 2000 scheduled dose of Depakote.  A: Writer administered scheduled medications to pt. Continued support and availability as needed was extended to this pt. Staff continue to monitor pt with q38min checks.  R: No adverse drug reactions noted. Pt receptive to treatment. Pt remains safe at this time.

## 2013-09-20 NOTE — Progress Notes (Signed)
D   Pt is irritable and agitated   He is verbally aggressive and was disruptive in group   He made the remark he wanted to kill everyone here   He had a very intense and angry look on his face and has made several threats to staff and other pts   He paces the hallways and said he wants to be discharged to smoke some pot   He expressed that he thought people were trying to lace his food and drink with substances to hurt him and he wanted to watch me open his medications and food  A   Verbal support given    Medications administered and effectiveness monitored   Q 15 min checks   Allow pt to observe medications and food  R   Pt safe and did accept food and drink and medications

## 2013-09-20 NOTE — [facility] Group Notes (Signed)
[facility] Group Therapy  09/20/2013 , 1:41 PM    Type of Therapy:  [facility] [employer] Presentation  Participation Level:  Active  Participation Quality:  Attentive  Affect:  Blunted  Cognitive:  Oriented  Insight:  Limited  Engagement in Therapy:  Engaged  Modes of Intervention:  Discussion, Education and Socialization  Summary of Progress/Problems:  Onalee Hua from [employer] came to present his recovery story and play the guitar.  Oluwadamilare requested that the presenter play his guitar listening to story for 10 minutes.  Accepted request to wait.  Not agitated nor angry.  Mood neutral.  Became very engaged verbally when presenter started playing.  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  Intrusive, but redirectable.  [name] 09/20/2013 , 1:41 PM

## 2013-09-21 DIAGNOSIS — R45851 Suicidal ideations: Secondary | ICD-10-CM

## 2013-09-21 MED ORDER — DIVALPROEX SODIUM 500 MG PO DR TAB
750.0000 mg | DELAYED_RELEASE_TABLET | Freq: Two times a day (BID) | ORAL | Status: DC
  Administered 2013-09-21 – 2013-09-26 (×10): 750 mg via ORAL
  Filled 2013-09-21 (×12): qty 1

## 2013-09-21 NOTE — Plan of Care (Signed)
Problem: Alteration in mood & ability to function due to Goal: STG: Patient verbalizes decreases in signs of withdrawal Outcome: Progressing Patient did not discuss wanting any marijuana today.

## 2013-09-21 NOTE — [facility] Group Notes (Signed)
[facility] Group Notes:  (Nursing/MHT/Case Management/Adjunct)  Date:  09/21/2013  Time:  11:13 AM  Type of Therapy:  Nurse Education  Participation Level:  Did Not Attend  Participation Quality:  none  Affect:  none  Cognitive:  none  Insight:  None  Engagement in Group:  None  Modes of Intervention:  Discussion  Summary of Progress/Problems: Patient did not attend group.  Bevelyn Arriola E 09/21/2013, 11:13 AM

## 2013-09-21 NOTE — [facility] Group Notes (Signed)
[facility] Group Notes:  (Nursing/MHT/Case Management/Adjunct)  Date:  09/21/2013  Time: 0900 am  Type of Therapy:  Psychoeducational Skills  Participation Level:  Did Not Attend   Troy Good 09/21/2013, 3:24 PM

## 2013-09-21 NOTE — Plan of Care (Signed)
Problem: Alteration in mood & ability to function due to Goal: LTG-Pt reports reduction in suicidal thoughts (Patient reports reduction in suicidal thoughts and is able to verbalize a safety plan for whenever patient is feeling suicidal)  Outcome: Progressing Pt denies suicidal thoughts this evening.
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
## 2013-09-21 NOTE — Plan of Care (Signed)
Problem: Alteration in mood & ability to function due to Goal: STG: Patient verbalizes decreases in signs of withdrawal Outcome: Not Progressing Pt continues to voice strong desire for marijuana.

## 2013-09-21 NOTE — Progress Notes (Signed)
Patient ID: Troy Good, male   DOB: [birth year], 23 y.o.   MRN: [id number]  D: Pt. Denies SI/HI and A/V Hallucinations. Patient does not report any pain or discomfort at this time. Patient rates his depression, anxiety, and hopelessness at 10/10 for the day. Patient states that he wants to go home and how he will meet this goal will be to, "be a good boy." Patient continues to show signs of paranoia and states his sister is a Sales promotion account executive when Clinical research associate told patient that she had called and was unable to talk due to being at work.   A: Support and encouragement provided to the patient to come to writer with any questions or concerns. Scheduled medications administered to patient per physician's orders.  R: Patient is minimal and isolative but polite when at medication window. Patient is rarely seen in the milieu but is talking to his peers periodically. Q15 minute checks are maintained for safety.

## 2013-09-21 NOTE — Progress Notes (Signed)
Pt resting in bed with eyes closed.  No distress observed.  Respirations even/unlabored.  Safety maintained with q15 minute checks. 

## 2013-09-21 NOTE — [facility] Group Notes (Signed)
[facility] Group Notes:  (Counselor/Nursing/MHT/Case Management/Adjunct)  09/21/2013 1:15PM  Type of Therapy:  Group Therapy  Participation Level:  Active  Participation Quality:  Appropriate  Affect:  Flat  Cognitive:  Oriented  Insight:  Improving  Engagement in Group:  Limited  Engagement in Therapy:  Limited  Modes of Intervention:  Discussion, Exploration and Socialization  Summary of Progress/Problems: The topic for group was balance in life.  Pt participated in the discussion about when their life was in balance and out of balance and how this feels.  Pt discussed ways to get back in balance and short term goals they can work on to get where they want to be. Troy Good likes to be the center of attention in group, and is not particularly invested in the topic.  He can make anything he says relate vaguely to the topic when pressed, and so is not as disruptive as he would be otherwise.  He tends to exaggerate and think only of himself, which gets tiresome after awhile.  He spent a lot of time complaining about the food, not being able to climb on the shelves in his room and not having music.  He also made a big deal about being IVCed, but also admitted that he needs the help that is being offered here.   [name] 09/21/2013 2:52 PM
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
## 2013-09-21 NOTE — Progress Notes (Signed)
D: "Im not suppose to be here". "Somebody sent me here that shouldn't have". "I want to sue". Pt reports having no need to be here at this time. Pt willingly took his scheduled Depakote. Pt was able to verbalize the indications of his Depakote. Pt was informed of the increased dose. Pt was active within the milieu. Pt went to karaoke this evening. Pt was calm and cooperative this evening. A: Writer administered scheduled and prn medications to pt. Continued support and availability as needed was extended to this pt. Staff continue to monitor pt with q65min checks.  R: No adverse drug reactions noted. Pt receptive to treatment. Pt remains safe at this time.

## 2013-09-22 NOTE — Tx Team (Signed)
Interdisciplinary Treatment Plan Update (Adult)   Date: 09/22/2013   Time Reviewed: 9:39 AM  Progress in Treatment:  Attending groups: Yes  Participating in groups: Yes Taking medication as prescribed: Yes  Tolerating medication: Yes  Family/Significant othe contact made: Not yet. SPE required/collateral information needed, as pt is poor historian.  Patient understands diagnosis: No. Pt disorganized, agitated, and verbally aggressive with staff. Poor historian.  Discussing patient identified problems/goals with staff: Yes  Medical problems stabilized or resolved: Yes  Denies suicidal/homicidal ideation: Yes. Patient has not harmed self or Others: Yes  New problem(s) identified: Pt continues to demonstrate some mood lability but is redirectable and shows progress in mental clarity. He continues to demonstrate delusional thinking and bizarre behavior. Pt oriented to time/place/person. He is making eye contact with tangential thinking/redirectable.  Discharge Plan or Barriers: Pt plans to return home with his parents and follow-up at [facility] for med management/assessment for therapy services. Pt demonstrates limited insight regarding mental illness and does not believe that he needs a referral for additional services at this time. "I just want my meds right and I'll be fine." Pt stated that he smokes marijuana to "level out" and does not plan to stop at d/c.  Additional comments: n/a  Reason for Continuation of Hospitalization: Mood stabilization Med management  Psychosis (disorganized; bizarre behavior) Estimated length of stay: 3-5 days   For review of initial/current patient goals, please see plan of care.  Attendees:  Patient:    Family:    Physician: Dr. Elna Breslow MD 09/22/2013 9:39 AM   Nursing: Marylu Lund RN   09/22/2013 9:39 AM   Clinical Social Worker Melanie Pellot Smart, LCSWA  09/22/2013. 9:39 AM   Other: Daryel Gerald LCSW 09/22/2013 9:39 AM   Other: Charleston Ropes, CSW Intern 09/22/2013 9:39 AM    Other:  Mardella Layman RN 09/22/2013 9:42 AM   Other:    Scribe for Treatment Team:  Trula Slade LCSWA  09/22/2013 9:39 AM

## 2013-09-22 NOTE — [facility] Group Notes (Signed)
[facility] LCSW Group Therapy  09/22/2013  1:05 PM  Type of Therapy:  Group therapy  Participation Level:  Active  Participation Quality:  Attentive  Affect:  Flat  Cognitive:  Oriented  Insight:  Limited  Engagement in Therapy:  Limited  Modes of Intervention:  Discussion, Socialization  Summary of Progress/Problems:  Chaplain was here to lead a group on themes of hope and courage. "Hope is something that comes when there is a group of people for support."  Went on to describe people that he connects with [and animals] but did not  Identify a particular group.  "I am hopeful that I will get back in college so that I can get a degree and make a good living.  I also want to get custody of my daughter."  Unable to come up with concrete steps of pursuing this.  [name] 09/22/2013 1:32 PM

## 2013-09-22 NOTE — Progress Notes (Signed)
Patient ID: Troy Good, male   DOB: 12/05/1990, 22 y.o.   MRN: [id number] D. Patient presents with irritable mood, affect labile. Troy Good continues to have pressured speech, and is tangential at times. He states '' I'm doing fine. I just wish I could go outside more. I need to go outside to help my anxiety. '' Pt thoughts continue to be disorganized at times. Pt completed self inventory he denies any depression, and rates anxiety at 4/10 on anxiety scale, 10 being worst. Pt states his goal is to ''get a new dog'' A. Medications given as ordered. Support and encouragement provided. Discussed above information and patients progress with Dr. Elna Breslow. R. Patient is safe. No further voiced concerns at this time. Will continue to monitor q 15 minutes for safety.

## 2013-09-22 NOTE — [facility] Group Notes (Signed)
[facility] LCSW Aftercare Discharge Planning Group Note   09/22/2013 10:12 AM  Participation Quality:  Engaged  Mood/Affect:  Appropriate  Depression Rating:  0  Anxiety Rating:  4  Thoughts of Suicide:  No Will you contract for safety?   NA  Current AVH:  No  Plan for Discharge/Comments:  Troy Good is singing the praises of Neurontin today.  He is also very focused on being able to go outside for recreation.  "My goal is to get into [employer], but I have to take care of some old bills.  I have already dropped out of school twice.  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  Transportation Means: parents  Supports: parents  [place], Clive B
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
## 2013-09-23 DIAGNOSIS — Z9119 Patient's noncompliance with other medical treatment and regimen: Secondary | ICD-10-CM

## 2013-09-23 DIAGNOSIS — Z91199 Patient's noncompliance with other medical treatment and regimen due to unspecified reason: Secondary | ICD-10-CM

## 2013-09-23 DIAGNOSIS — F1210 Cannabis abuse, uncomplicated: Secondary | ICD-10-CM

## 2013-09-23 DIAGNOSIS — F312 Bipolar disorder, current episode manic severe with psychotic features: Secondary | ICD-10-CM
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
## 2013-09-23 NOTE — [facility] Group Notes (Signed)
[facility] Group Notes:  (Nursing/MHT/Case Management/Adjunct)  Date:  09/23/2013  Time:  9:46 AM  Type of Therapy:  Psychoeducational Skills-self inventory  Participation Level:  Active  Participation Quality:  Appropriate  Affect:  Irritable  Cognitive:  Alert  Insight:  Appropriate  Engagement in Group:  Distracting and Engaged  Modes of Intervention:  Discussion, Education and Exploration  Summary of Progress/Problems:  Troy Good 09/23/2013, 9:46 AM

## 2013-09-23 NOTE — [facility] Group Notes (Signed)
[facility] Group Notes:  (Nursing/MHT/Case Management/Adjunct)  Date:  09/23/2013  Time:  9:44 AM  Type of Therapy:  Psychoeducational Skills  Participation Level:  Active  Participation Quality:  Appropriate  Affect:  Irritable and Labile  Cognitive:  Alert  Insight:  Appropriate  Engagement in Group:  Distracting  Modes of Intervention:  Discussion, Education and Exploration  Summary of Progress/Problems: healthy coping skills  Malva Limes 09/23/2013, 9:44 AM

## 2013-09-23 NOTE — Progress Notes (Signed)
Patient ID: RYMAN RATHGEBER, male   DOB: 05/06/1990, 23 y.o.   MRN: [id number] D)  Was in the hall this evening, walking with his sister who had come to visit.  Came to med window after visit was over to take his hs depakote, and then to the dayroom for group.  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  Stated he wasn't really, didn't shoot people, only zombies, getting ready for the zombie apocolypse, and gave a small smile.  Has been pacing on the hall, stated he wasn't getting enough exercise.  Stated he wants to go back to school, stated was at [employer] in [place], hoping to get into [employer], wants to teach music, had been studying business.   A)  Will continue to monitor for safety, continue POC, and try to develop therapeutic rapport R)  Safety maintained.

## 2013-09-23 NOTE — Progress Notes (Signed)
[facility] Group Notes:  (Nursing/MHT/Case Management/Adjunct)  Date:  09/23/2013  Time:  9:36 PM  Type of Therapy:  Group Therapy  Participation Level:  Active  Participation Quality:  Appropriate  Affect:  Appropriate  Cognitive:  Appropriate  Insight:  Appropriate  Engagement in Group:  Engaged  Modes of Intervention:  Socialization and Support  Summary of Progress/Problems: Pt. Stated his energy level was a 10.   Pt. Listed boxing and landscaping as coping skills.  Sondra Come 09/23/2013, 9:36 PM

## 2013-09-23 NOTE — [facility] Group Notes (Signed)
[facility] Group Notes:  (Clinical Social Work)  09/23/2013  11:15-12:00PM  Summary of Progress/Problems:   The main focus of today's process group was to discuss patients' feelings about hospitalization, the stigma attached to mental health, and sources of motivation to stay well.  We then worked to identify a specific plan to avoid future hospitalizations when discharged from the hospital for this admission.  The patient expressed that he feels okay physically today but is having vision issues that are bothering him.  He stated he is in the hospital "because I'm bipolar/paranoid schizophrenic."  He was accepting of the idea that he may want to consider saying "I have an illness called Schizophrenia" instead.  He stated that it is his hobby to "bait police officers" and that he does not like police officers because they keep making problems for him that end up with him being hospitalized.  His feelings about being in the hospital are "handicapped, disabled, not accepted by society."  He stated that he does not like "being in the nuthouse, that's what I've called it since I was 23 years old" and then contradicted that by saying he was just diagnosed 3 weeks ago.  He was not resistant to the concepts presented, but did not show insight or understanding either.  Type of Therapy:  Group Therapy - Process  Participation Level:  Active  Participation Quality:  Attentive and Sharing  Affect:  Blunted and Irritable  Cognitive:  Alert  Insight:  Engaged  Engagement in Therapy:  Engaged  Modes of Intervention:  Exploration, Discussion  [name], LCSW 09/23/2013, 12:49 PM

## 2013-09-23 NOTE — Progress Notes (Signed)
Patient ID: Troy Good, male   DOB: 04/05/1990, 23 y.o.   MRN: [id number] D. Patient presents with irritable mood, affect labile again today. Troy Good continues to be easily agitated at times, and restless/pacing on the unit. He continues to have poor insight into his illness stating '' I'm going to sue my step mother for getting me in the mental joint all the time. She keeps doing this to me and it's discrimination. '' He is cooperative with staff redirection, but mood remains labile. A. Medications given as ordered. Support and encouragement provided. Discussed above information and patients progress with Dr. Jannifer Franklin. R. Patient is safe. No further voiced concerns at this time , he only states '' I just want to go outside all the time. '' . Will continue to monitor q 15 minutes for safety.

## 2013-09-23 NOTE — Progress Notes (Signed)
D: Pt denies SI/HI/AVH. Pt is pleasant and cooperative. Pt continues to be disorganized, but is calm tonight. Pt has flight of ideas when talking, going from talking about going to the beach then talking about court before finishing the thought.   A: Pt was offered support and encouragement. Pt was given scheduled medications. Pt was encourage to attend groups. Q 15 minute checks were done for safety.   R:Pt attends groups and interacts well with peers and staff. Pt is taking medication. Pt has no complaints.Pt receptive to treatment and safety maintained on unit.

## 2013-09-24 MED ORDER — BENZTROPINE MESYLATE 0.5 MG PO TABS
0.5000 mg | ORAL_TABLET | Freq: Two times a day (BID) | ORAL | Status: DC
Start: 2013-09-24 — End: 2013-09-26
  Administered 2013-09-24 – 2013-09-26 (×4): 0.5 mg via ORAL
  Filled 2013-09-24 (×7): qty 1

## 2013-09-24 NOTE — [facility] Group Notes (Signed)
[facility] Group Notes:  (Clinical Social Work)  09/24/2013   11:15am-12:00pm  Summary of Progress/Problems:  The main focus of today's process group was to listen to a variety of genres of music and to identify that different types of music provoke different responses.  The patient then was able to identify personally what was soothing for them, as well as energizing.    The patient expressed understanding of concepts, as well as knowledge of how each type of music affected him/her and how this can be used at home as a wellness/recovery tool.  Troy Good was irritable at times, stating that no music was being played that he liked.  He talked about how his preference is for hip hip or rap music, but he does not like to listen to rock music, because it is against the religion that he grew up in (which he stated was [employer]).  He left the room in agitation, demanding a nerve pill, and then returned and apologized later.  He greatly enjoyed the symphony music.  Type of Therapy:  Music Therapy   Participation Level:  Active  Participation Quality:  Attentive and Resistant and Irritable  Affect:  Blunted  Cognitive:  N/A - could not assess  Insight:  Limited  Engagement in Therapy:  Limited  Modes of Intervention:   Activity, Exploration  [name], LCSW 09/24/2013, 12:30pm

## 2013-09-24 NOTE — Progress Notes (Signed)
Pt stated that he had a good day, just bored. The first thing he plans to do is make sure his cats or okay and listen to some music.
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
## 2013-09-24 NOTE — Progress Notes (Signed)
Patient ID: Troy Good, male   DOB: Jan 30, 1990, 23 y.o.   MRN: [id number] D. Patient presents with irritable mood, affect congruent. [name]  Continues to report feeling restless, but states '' I'm doing better. My thoughts aren't racing. I just really don't want to be here on my birthday. '' Patient has been cooperative, and attending unit programming. He denies any AH/VH or SI/HI. A. Medications given as ordered. Support and encouragement provided. Discussed above information and patients progress with Dr. Jannifer Franklin. R. Patient is safe. No further voiced concerns at this time . Will continue to monitor q 15 minutes for safety.

## 2013-09-24 NOTE — [facility] Group Notes (Signed)
[facility] Group Notes:  (Nursing/MHT/Case Management/Adjunct)  Date:  09/24/2013  Time: 0920 and 0940 am  Type of Therapy:  Psychoeducational Skills  Participation Level:  Did Not Attend   [place] Mon 09/24/2013, 1:53 PM

## 2013-09-24 NOTE — Progress Notes (Signed)
Patient ID: Troy Good, male   DOB: 1990-05-23, 23 y.o.   MRN: [id number] D)  Pt's sister was here to visit again this evening at the beginning of the shift, and his girlfriend came to visit a little later.  Has been pleasant, cooperative, logical, stated would like to go back to school but can;t afford to go now, but it is his goal to go back.  Denies voices, stated the scrapes on his knuckles were from before he came in and had hit some things, are healing.  Stated has learned to control his anger and not act out, denies SI/HI. A)  Will continue to monitor for safety, continue POC R)  Safety maintained.

## 2013-09-25 LAB — VALPROIC ACID LEVEL: Valproic Acid Lvl: 85.7 ug/mL (ref 50.0–100.0)

## 2013-09-25 MED ORDER — ARIPIPRAZOLE ER 300 MG IM SUSR
300.0000 mg | INTRAMUSCULAR | Status: DC
  Administered 2013-09-25: 300 mg via INTRAMUSCULAR

## 2013-09-25 NOTE — Progress Notes (Signed)
Pt was very excited today as his mom and GF came to visit him during dinner. Pt stated he is looking forward to discharge tomorrow. He contracts for safety and has been in the mileu with the other pts. Presently he has a bright affect but is disappointed he never got to play music in his room.
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
## 2013-09-25 NOTE — [facility] Group Notes (Signed)
[facility] LCSW Aftercare Discharge Planning Group Note   09/25/2013 10:59 AM  Participation Quality:  Engaged  Mood/Affect:  Grumpy  Depression Rating:  2  Anxiety Rating:  8 because the Dr is not releasing today  Thoughts of Suicide:  No Will you contract for safety?   NA  Current AVH:  No  Plan for Discharge/Comments:  Ashford is unhappy because tomorrow is his birthday, and the Dr is not releasing him today.  He states he is OK with getting the shot today, and is willing to continue to take his meds.  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    Transportation Means: family  Supports: family  [place], Baldo Daub

## 2013-09-26 DIAGNOSIS — F1220 Cannabis dependence, uncomplicated: Secondary | ICD-10-CM

## 2013-09-26 DIAGNOSIS — F259 Schizoaffective disorder, unspecified: Principal | ICD-10-CM

## 2013-09-26 MED ORDER — ARIPIPRAZOLE ER 300 MG IM SUSR: 300.0000 mg | INTRAMUSCULAR | Status: AC

## 2013-09-26 MED ORDER — BENZTROPINE MESYLATE 0.5 MG PO TABS: 0.5000 mg | ORAL_TABLET | Freq: Two times a day (BID) | ORAL | Status: AC

## 2013-09-26 MED ORDER — HYDROXYZINE HCL 50 MG PO TABS
50.0000 mg | ORAL_TABLET | Freq: Three times a day (TID) | ORAL | Status: DC | PRN
  Filled 2013-09-26: qty 30

## 2013-09-26 MED ORDER — HYDROXYZINE HCL 50 MG PO TABS: ORAL_TABLET | ORAL | Status: AC

## 2013-09-26 MED ORDER — GABAPENTIN 400 MG PO CAPS: 400.0000 mg | ORAL_CAPSULE | Freq: Two times a day (BID) | ORAL | Status: AC

## 2013-09-26 MED ORDER — ARIPIPRAZOLE 10 MG PO TABS: 10.0000 mg | ORAL_TABLET | Freq: Two times a day (BID) | ORAL | Status: AC

## 2013-09-26 MED ORDER — TRAZODONE HCL 50 MG PO TABS: 50.0000 mg | ORAL_TABLET | Freq: Every evening | ORAL | Status: AC | PRN

## 2013-09-26 MED ORDER — DIVALPROEX SODIUM 250 MG PO DR TAB
750.0000 mg | DELAYED_RELEASE_TABLET | Freq: Two times a day (BID) | ORAL | Status: DC
  Filled 2013-09-26 (×2): qty 84

## 2013-09-26 MED ORDER — DIVALPROEX SODIUM 250 MG PO DR TAB: 750.0000 mg | DELAYED_RELEASE_TABLET | Freq: Two times a day (BID) | ORAL | Status: AC

## 2013-09-26 NOTE — Tx Team (Signed)
Interdisciplinary Treatment Plan Update (Adult)   Date: 09/26/2013   Time Reviewed: 10:26 AM  Progress in Treatment:  Attending groups: Yes  Participating in groups: Yes Taking medication as prescribed: Yes  Tolerating medication: Yes  Family/Significant othe contact made: SPE completed with pt's sister.   Patient understands diagnosis: Yes Discussing patient identified problems/goals with staff: Yes  Medical problems stabilized or resolved: Yes  Denies suicidal/homicidal ideation: Yes. Patient has not harmed self or Others: Yes  New problem(s) identified: n/a Discharge Plan or Barriers: Pt plans to return home with his parents and follow-up at [facility] for med management/assessment for therapy services. Pt demonstrates limited insight regarding mental illness and does not believe that he needs a referral for additional services at this time. "I just want my meds right and I'll be fine." Pt stated that he smokes marijuana to "level out" and does not plan to stop at d/c.  Additional comments: n/a  Reason for Continuation of Hospitalization: d/c today  Estimated length of stay: d/c today For review of initial/current patient goals, please see plan of care.  Attendees:  Patient:    Family:    Physician: Dr. Elna Breslow MD 09/26/2013 10:26 AM   Nursing: Vanessa Kick RN 09/26/2013 10:26 AM   Clinical Social Worker Racer Quam Smart, LCSWA  09/26/2013. 10:26 AM   Other: Daryel Gerald LCSW 09/26/2013 10:26 AM   Other: 09/26/2013 10:26 AM   Other:  09/26/2013 10:26 AM   Other:    Scribe for Treatment Team:  [employer] LCSWA  09/26/2013 10:26 AM

## 2013-09-26 NOTE — Progress Notes (Signed)
[facility] Adult Case Management Discharge Plan :  Will you be returning to the same living situation after discharge: Yes,  home At discharge, do you have transportation home?:Yes,  mother Do you have the ability to pay for your medications:Yes,  Medicaid  Release of information consent forms completed and submitted to Medical Records by CSW.  Patient to Follow up at: Follow-up Information   Follow up with [facility] On 10/02/2013. (Appt with therapist at 2:00PM on this date. )    Contact information:   [address] [place] [address] Phone: [phone] Fax: [phone]      Follow up with [facility] On 10/04/2013. (Appt at 1:20PM with Dr. June Leap for hospital follow-up/medication management. )    Contact information:   [address] [place] [address] Phone: [phone] Fax: [phone]      Patient denies SI/HI:   Yes,  group/self report.     Safety Planning and Suicide Prevention discussed:  Yes,  SPE completed with pt's sister. SPI pamphlet provided to pt and he was encouraged to share information with support network, ask questions, and talk about any concerns relating to SPE.  Smart, Quantavia Frith LCSWA  09/26/2013, 10:25 AM

## 2013-09-26 NOTE — Progress Notes (Signed)
Pt laying in bed resting with eyes closed. Respirations even and unlabored. No distress noted. Monitor 15 min for safety. Pt remains safe.

## 2013-09-26 NOTE — [facility] Suicide Risk Assessment (Signed)
Demographic Factors:  Male and Caucasian  Total Time spent with patient: 20 minutes  Psychiatric Specialty Exam: Physical Exam  Constitutional: He is oriented to person, place, and time. He appears well-developed and well-nourished.  HENT:  Head: Normocephalic and atraumatic.  Eyes: Conjunctivae are normal. Pupils are equal, round, and reactive to light.  Neck: Normal range of motion. Neck supple.  Cardiovascular: Normal rate and regular rhythm.   Respiratory: Effort normal and breath sounds normal.  GI: Soft.  Musculoskeletal: Normal range of motion.  Neurological: He is alert and oriented to person, place, and time.  Skin: Skin is warm.  Psychiatric: He has a normal mood and affect. His speech is normal and behavior is normal. Judgment normal. Thought content is not delusional. Cognition and memory are normal. He expresses no homicidal and no suicidal ideation.    Review of Systems  Constitutional: Negative.   HENT: Negative.   Eyes: Negative.   Respiratory: Negative.   Cardiovascular: Negative.   Gastrointestinal: Negative.   Genitourinary: Negative.   Musculoskeletal: Negative.   Skin: Negative.   Psychiatric/Behavioral: Positive for substance abuse (stable). Negative for depression, suicidal ideas and hallucinations. The patient is not nervous/anxious and does not have insomnia.     Blood pressure 115/72, pulse 91, temperature 98.3 F (36.8 C), temperature source Oral, resp. rate 16, height 5' 7.5" (1.715 m), weight 79.379 kg (175 lb).Body mass index is 26.99 kg/(m^2).  General Appearance: Casual  Eye Contact::  Good  Speech:  Clear and Coherent  Volume:  Normal  Mood:  Euthymic  Affect:  Congruent  Thought Process:  Goal Directed  Orientation:  Full (Time, Place, and Person)  Thought Content:  denies AH/VH  Suicidal Thoughts:  No  Homicidal Thoughts:  No  Memory:  Immediate;   Good Recent;   Good Remote;   Good  Judgement:  Intact  Insight:  Good   Psychomotor Activity:  Normal  Concentration:  Good  Recall:  Good  Fund of Knowledge:Good  Language: Fair  Akathisia:  No    AIMS (if indicated):   0  Assets:  Communication Skills Desire for Improvement Housing Intimacy Social Support Vocational/Educational  Sleep:  Number of Hours: 6    Musculoskeletal: Strength & Muscle Tone: within normal limits Gait & Station: normal Patient leans: N/A   Mental Status Per Nursing Assessment::   On Admission:     Current Mental Status by Physician: Denies AH/VH/SI/HI  Loss Factors: Legal issues  Historical Factors: Impulsivity  Risk Reduction Factors:   Religious beliefs about death, Employed, Positive social support, Positive therapeutic relationship and Positive coping skills or problem solving skills  Continued Clinical Symptoms:  Alcohol/Substance Abuse/Dependencies Previous Psychiatric Diagnoses and Treatments  Cognitive Features That Contribute To Risk:  Closed-mindedness    Suicide Risk:  Minimal: No identifiable suicidal ideation.  Patients presenting with no risk factors but with morbid ruminations; may be classified as minimal risk based on the severity of the depressive symptoms  Discharge Diagnoses:   DSM5:  Primary psychiatric diagnosis:  Bipolar disorder type I manic , severe ,with psychosis,multiple episodes ,currently in acute episode (RESOLVING)  Secondary psychiatric diagnosis:  Non compliance with medical treatment  Cannabis use disorder   Non psychiatric diagnosis:  None   Past Medical History  Diagnosis Date  . Schizophrenia   . Bipolar 2 disorder     Plan Of Care/Follow-up recommendations:  Activity:  No restrictions  Is patient on multiple antipsychotic therapies at discharge:  No   Has  Patient had three or more failed trials of antipsychotic monotherapy by history:  No  Recommended Plan for Multiple Antipsychotic Therapies: NA    Troy Good 09/26/2013, 8:30 AM

## 2013-09-26 NOTE — [facility] Group Notes (Signed)
[facility] Group Notes:  (Nursing/MHT/Case Management/Adjunct)  Date:  09/26/2013  Time:  1000  Type of Therapy:  Nurse Education, goals group  Participation Level:  Active  Participation Quality:  Appropriate, Attentive and Sharing  Affect:  Appropriate  Cognitive:  Alert and Oriented  Insight:  Appropriate  Engagement in Group:  Engaged  Modes of Intervention:  Activity, Discussion and Socialization  Summary of Progress/Problems:Pt participated appropriately and assisted in leading stretching exercises at the beginning of this group. Beatrix Shipper 09/26/2013, 10:43 AM

## 2013-09-26 NOTE — Discharge Summary (Signed)
Physician Discharge Summary Note  Patient:  Troy Good is an 23 y.o., male MRN:  [id number] DOB:  [birth year] Patient phone:  [phone] (home)  Patient address:   [address] [place] [address],  Total Time spent with patient: Greater than 30 minutes  Date of Admission:  09/19/2013 Date of Discharge: 09/26/13  Reason for Admission: Mood stabilization  Discharge Diagnoses: Principal Problem:   Schizoaffective disorder   Psychiatric Specialty Exam: Physical Exam  Psychiatric: His speech is normal and behavior is normal. Judgment and thought content normal. His mood appears not anxious. His affect is not angry, not blunt, not labile and not inappropriate. Cognition and memory are normal. He does not exhibit a depressed mood.    Review of Systems  Constitutional: Negative.   HENT: Negative.   Eyes: Negative.   Respiratory: Negative.   Cardiovascular: Negative.   Gastrointestinal: Negative.   Genitourinary: Negative.   Musculoskeletal: Negative.   Skin: Negative.   Neurological: Negative.   Endo/Heme/Allergies: Negative.   Psychiatric/Behavioral: Positive for depression (Stabilized with medication prior discharge), hallucinations (Hx. Delusional thoughts) and substance abuse (Hx. Cannabis dependence). Negative for suicidal ideas and memory loss. The patient has insomnia (Stabilized with medication prior to discharge). The patient is not nervous/anxious.     Blood pressure 127/72, pulse 77, temperature 98 F (36.7 C), temperature source Oral, resp. rate 20, height 5' 7.5" (1.715 m), weight 79.379 kg (175 lb).Body mass index is 26.99 kg/(m^2).   General Appearance: Casual   Eye Contact:: Good   Speech: Clear and Coherent   Volume: Normal   Mood: Euthymic   Affect: Congruent   Thought Process: Goal Directed   Orientation: Full (Time, Place, and Person)   Thought Content: denies AH/VH   Suicidal Thoughts: No   Homicidal Thoughts: No   Memory: Immediate;  Good  Recent; Good  Remote; Good   Judgement: Intact   Insight: Good   Psychomotor Activity: Normal   Concentration: Good   Recall: Good   Fund of Knowledge:Good   Language: Fair   Akathisia: No     AIMS (if indicated): 0   Assets: Communication Skills  Desire for Improvement  Housing  Intimacy  Social Support  Vocational/Educational   Sleep: Number of Hours: 6    Past Psychiatric History: Diagnosis: Schizoaffective disorder, Cannabis dependence  Hospitalizations: [facility] adult unit  Outpatient Care: [facility]  Substance Abuse Care: [facility]  Self-Mutilation: NA  Suicidal Attempts: NA  Violent Behaviors: NA   Musculoskeletal: Strength & Muscle Tone: within normal limits Gait & Station: normal Patient leans: N/A  DSM5: Schizophrenia Disorders:  NA Obsessive-Compulsive Disorders:  NA Trauma-Stressor Disorders:  NA Substance/Addictive Disorders:  Cannabis Use Disorder - Severe (304.30) Depressive Disorders:  Schizoaffective disorder  Axis Diagnosis:  AXIS I:  Schizoaffective disorder, Cannabis dependence AXIS II:  Deferred AXIS III:   Past Medical History  Diagnosis Date  . Schizophrenia   . Bipolar 2 disorder    AXIS IV:  other psychosocial or environmental problems and mental illness, chronic, cannabis dependence AXIS V:  64  Level of Care:  OP  Hospital Course:  Mr. Cannata is a 23 year old Caucasian male, single ,employed with hx of schizoaffective disorder vs bipolar disorder. He presented to [facility] by his step mother Eriq Hufford for bizzare behavior, threatening, [place]. Patient after admission to the [facility] was agitated, loud and aggressive, banging on doors, yelling, hostile, irritable, climbing the shelves in his room. Patient appears  to be bizarre, has napkins made of paper wrapped around his hand, reports that it takes away his power so that he does not hurt anybody.  After admission assessment/evaluation, it was determined  that Stryker will need medication management to re-stabilize his bizzare behavior. He was ordered, medicated and discharged on Abilify 10 mg twice daily for mood control, Abilify Maintena 300 mg Q monthly IM for mood control (due to be administered on 10/25/13), Depakote 750 mg Q bedtime for mood stabilization, Cogentin 0.5 mg bid for prevention of EPS, Neurontin 400 mg twice daily for agitation, Hydroxyzine 50 mg three times daily as needed for anxiety and Trazodone 50 mg Q bedtime for sleep. Daviyon presented no other significant medical issues that required treatment and or monitoring. However, he was enrolled in the group counseling sessions being offered and held on this unit. He learned coping skills.  Katlin's symptoms responded well to his treatment regimen. This is evidenced by his reports of improved mood. He was noted to be motivated for recovery during his stay in this hospital. He worked closely with the treatment team and case manager to develop a discharge plan with appropriate goals in mind to maintain mood stability. Coping skills, problem solving as well as relaxation therapies were also part of his unit programming. On the day of his discharge, he was in much improved condition than upon admission.  His symptoms were reported as significantly decreased or resolved completely. Upon discharge, he denies any SI/HI, AVH, delusional thoughts and or paranoia. He was motivated to continue taking medication with a goal of continued improvement in mental health.    Laurin will follow-up care at the [facility] here in [place], [place]. He is provided with all the necessary information required to make this appointment without problems. He received from the [facility] pharmacy a 14 days worth, supply samples of his [facility] discharge medications. He left [facility] with all personal belongings in no apparent distress. Transportation per mother.  Consults:  psychiatry  Significant Diagnostic Studies:  labs: CBC with diff,  CMP, UDS, toxicology tests, U/A, Depakote leveles  Discharge Vitals:   Blood pressure 127/72, pulse 77, temperature 98 F (36.7 C), temperature source Oral, resp. rate 20, height 5' 7.5" (1.715 m), weight 79.379 kg (175 lb). Body mass index is 26.99 kg/(m^2). Lab Results:   Results for orders placed during the hospital encounter of 09/19/13 (from the past 72 hour(s))  VALPROIC ACID LEVEL     Status: None   Collection Time    09/25/13  6:30 AM      Result Value Ref Range   Valproic Acid Lvl 85.7  50.0 - 100.0 ug/mL   Comment: Performed at [facility]    Physical Findings: AIMS:  , ,  ,  ,    CIWA:    COWS:     Psychiatric Specialty Exam: See Psychiatric Specialty Exam and Suicide Risk Assessment completed by Attending Physician prior to discharge.  Discharge destination:  Home  Is patient on multiple antipsychotic therapies at discharge:  No   Has Patient had three or more failed trials of antipsychotic monotherapy by history:  No  Recommended Plan for Multiple Antipsychotic Therapies: NA    Medication List       Indication   ARIPiprazole 10 MG tablet  Commonly known as:  ABILIFY  Take 1 tablet (10 mg total) by mouth 2 (two) times daily. For mood control   Indication:  Mood control     ARIPiprazole 300 MG  Susr  Commonly known as:  ABILIFY MAINTENA  Inject 300 mg into the muscle every 28 (twenty-eight) days. (This medicine is due to be administered on 10-25-13): For mood control   Indication:  Mood control     benztropine 0.5 MG tablet  Commonly known as:  COGENTIN  Take 1 tablet (0.5 mg total) by mouth 2 (two) times daily. For prevention of drug induced involuntary movements   Indication:  Extrapyramidal Reaction caused by Medications     divalproex 250 MG DR tablet  Commonly known as:  DEPAKOTE  Take 3 tablets (750 mg total) by mouth every 12 (twelve) hours. For mood stabilization   Indication:  Mood stabilization     gabapentin 400 MG capsule   Commonly known as:  NEURONTIN  Take 1 capsule (400 mg total) by mouth 2 (two) times daily. For agitation   Indication:  Agitattion     hydrOXYzine 50 MG tablet  Commonly known as:  ATARAX/VISTARIL  Take 1 tablet (50 mg) three times daily as needed: For anxiety   Indication:  Tension, Anxiety     traZODone 50 MG tablet  Commonly known as:  DESYREL  Take 1 tablet (50 mg total) by mouth at bedtime and may repeat dose one time if needed. For sleep   Indication:  Trouble Sleeping       Follow-up Information   Follow up with [facility] On 10/02/2013. (Appt with therapist at 2:00PM on this date. )    Contact information:   [address] [place], [place] [address] Phone: [phone] Fax: [phone]      Follow up with [facility] On 10/04/2013. (Appt at 1:20PM with Dr. June Leap for hospital follow-up/medication management. )    Contact information:   [address] [place] [address] Phone: [phone] Fax: [phone]     Follow-up recommendations:  Activity:  As tolerated Diet: As recommended by your primary care doctor. Keep all scheduled follow-up appointments as recommended.  Comments: Take all your medications as prescribed by your mental healthcare provider. Report any adverse effects and or reactions from your medicines to your outpatient provider promptly. Patient is instructed and cautioned to not engage in alcohol and or illegal drug use while on prescription medicines. In the event of worsening symptoms, patient is instructed to call the crisis hotline, 911 and or go to the nearest ED for appropriate evaluation and treatment of symptoms. Follow-up with your primary care provider for your other medical issues, concerns and or health care needs.     Total Discharge Time:  Greater than 30 minutes.  SignedSanjuana Kava, PMHP 09/26/2013, 2:29 PM
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
## 2013-09-27 NOTE — Discharge Summary (Signed)
Patient was seen face to face for psychiatric evaluation, suicide risk assessment and case discussed with treatment team and NP and made appropriate disposition plans. Reviewed the information documented and agree with the treatment plan.   Chesney Klimaszewski ,MD Attending Psychiatrist  [facility]    

## 2013-09-29 NOTE — Progress Notes (Signed)
Patient Discharge Instructions:  After Visit Summary (AVS):   Faxed to:  09/29/13 Discharge Summary Note:   Faxed to:  09/29/13 Psychiatric Admission Assessment Note:   Faxed to:  09/29/13 Suicide Risk Assessment - Discharge Assessment:   Faxed to:  09/29/13 Faxed/Sent to the Next Level Care provider:  09/29/13 Faxed to [facility] @ [phone]  Jerelene Redden, 09/29/2013, 4:03 PM

## 2013-10-27 ENCOUNTER — Inpatient Hospital Stay: Payer: Self-pay | Admitting: Psychiatry

## 2013-10-28 LAB — CBC WITH DIFFERENTIAL/PLATELET
Basophil #: 0.1 10*3/uL (ref 0.0–0.1)
Basophil %: 0.9 %
Eosinophil #: 0.2 10*3/uL (ref 0.0–0.7)
Eosinophil %: 2 %
HCT: 50.8 % (ref 40.0–52.0)
HGB: 16.4 g/dL (ref 13.0–18.0)
LYMPHS ABS: 2.3 10*3/uL (ref 1.0–3.6)
LYMPHS PCT: 29.5 %
MCH: 28.7 pg (ref 26.0–34.0)
MCHC: 32.3 g/dL (ref 32.0–36.0)
MCV: 89 fL (ref 80–100)
Monocyte #: 0.7 x10 3/mm (ref 0.2–1.0)
Monocyte %: 9.7 %
Neutrophil #: 4.4 10*3/uL (ref 1.4–6.5)
Neutrophil %: 57.9 %
Platelet: 216 10*3/uL (ref 150–440)
RBC: 5.71 10*6/uL (ref 4.40–5.90)
RDW: 14.4 % (ref 11.5–14.5)
WBC: 7.6 10*3/uL (ref 3.8–10.6)

## 2013-10-28 LAB — COMPREHENSIVE METABOLIC PANEL
ALK PHOS: 60 U/L
ANION GAP: 7 (ref 7–16)
Albumin: 3.9 g/dL (ref 3.4–5.0)
BILIRUBIN TOTAL: 0.5 mg/dL (ref 0.2–1.0)
BUN: 14 mg/dL (ref 7–18)
CALCIUM: 8.5 mg/dL (ref 8.5–10.1)
CREATININE: 0.98 mg/dL (ref 0.60–1.30)
Chloride: 110 mmol/L — ABNORMAL HIGH (ref 98–107)
Co2: 25 mmol/L (ref 21–32)
EGFR (African American): >60
EGFR (Non-African Amer.): >60
Glucose: 88 mg/dL (ref 65–99)
OSMOLALITY: 283 (ref 275–301)
POTASSIUM: 4 mmol/L (ref 3.5–5.1)
SGOT(AST): 23 U/L (ref 15–37)
SGPT (ALT): 21 U/L
SODIUM: 142 mmol/L (ref 136–145)
Total Protein: 7.4 g/dL (ref 6.4–8.2)

## 2013-10-28 LAB — DRUG SCREEN, URINE
Amphetamines, Ur Screen: NEGATIVE (ref ?–1000)
BENZODIAZEPINE, UR SCRN: NEGATIVE (ref ?–200)
Barbiturates, Ur Screen: NEGATIVE (ref ?–200)
Cannabinoid 50 Ng, Ur ~~LOC~~: POSITIVE (ref ?–50)
Cocaine Metabolite,Ur ~~LOC~~: NEGATIVE (ref ?–300)
MDMA (Ecstasy)Ur Screen: NEGATIVE (ref ?–500)
METHADONE, UR SCREEN: NEGATIVE (ref ?–300)
Opiate, Ur Screen: NEGATIVE (ref ?–300)
Phencyclidine (PCP) Ur S: NEGATIVE (ref ?–25)
Tricyclic, Ur Screen: NEGATIVE (ref ?–1000)

## 2013-10-28 LAB — URINALYSIS, COMPLETE
Bacteria: NONE SEEN
Bilirubin,UR: NEGATIVE
Blood: NEGATIVE
Glucose,UR: NEGATIVE mg/dL (ref 0–75)
Ketone: NEGATIVE
LEUKOCYTE ESTERASE: NEGATIVE
NITRITE: NEGATIVE
PROTEIN: NEGATIVE
Ph: 5 (ref 4.5–8.0)
RBC,UR: 2 /HPF (ref 0–5)
Specific Gravity: 1.017 (ref 1.003–1.030)
Squamous Epithelial: NONE SEEN

## 2013-10-28 LAB — HEMOGLOBIN A1C: Hemoglobin A1C: 5.2 % (ref 4.2–6.3)

## 2013-10-28 LAB — TSH: Thyroid Stimulating Horm: 1.3 u[IU]/mL
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
## 2020-07-19 DIAGNOSIS — Z419 Encounter for procedure for purposes other than remedying health state, unspecified: Secondary | ICD-10-CM | POA: Diagnosis not present

## 2020-08-09 DIAGNOSIS — M79672 Pain in left foot: Secondary | ICD-10-CM | POA: Diagnosis not present

## 2020-08-09 DIAGNOSIS — Z6827 Body mass index (BMI) 27.0-27.9, adult: Secondary | ICD-10-CM | POA: Diagnosis not present

## 2020-08-09 DIAGNOSIS — M722 Plantar fascial fibromatosis: Secondary | ICD-10-CM | POA: Diagnosis not present

## 2020-08-09 DIAGNOSIS — G8929 Other chronic pain: Secondary | ICD-10-CM | POA: Diagnosis not present

## 2020-08-09 DIAGNOSIS — K219 Gastro-esophageal reflux disease without esophagitis: Secondary | ICD-10-CM | POA: Diagnosis not present

## 2020-08-09 DIAGNOSIS — F4310 Post-traumatic stress disorder, unspecified: Secondary | ICD-10-CM | POA: Diagnosis not present

## 2020-08-19 DIAGNOSIS — Z419 Encounter for procedure for purposes other than remedying health state, unspecified: Secondary | ICD-10-CM | POA: Diagnosis not present

## 2020-09-19 DIAGNOSIS — Z419 Encounter for procedure for purposes other than remedying health state, unspecified: Secondary | ICD-10-CM | POA: Diagnosis not present

## 2020-10-02 DIAGNOSIS — Z6827 Body mass index (BMI) 27.0-27.9, adult: Secondary | ICD-10-CM | POA: Diagnosis not present

## 2020-10-02 DIAGNOSIS — G5601 Carpal tunnel syndrome, right upper limb: Secondary | ICD-10-CM | POA: Diagnosis not present

## 2020-10-02 DIAGNOSIS — M25531 Pain in right wrist: Secondary | ICD-10-CM | POA: Diagnosis not present

## 2020-10-02 DIAGNOSIS — Z2821 Immunization not carried out because of patient refusal: Secondary | ICD-10-CM | POA: Diagnosis not present

## 2020-10-19 DIAGNOSIS — Z419 Encounter for procedure for purposes other than remedying health state, unspecified: Secondary | ICD-10-CM | POA: Diagnosis not present

## 2020-11-04 DIAGNOSIS — J069 Acute upper respiratory infection, unspecified: Secondary | ICD-10-CM | POA: Diagnosis not present

## 2020-11-04 DIAGNOSIS — R051 Acute cough: Secondary | ICD-10-CM | POA: Diagnosis not present

## 2020-11-04 DIAGNOSIS — F17210 Nicotine dependence, cigarettes, uncomplicated: Secondary | ICD-10-CM | POA: Diagnosis not present

## 2020-11-04 DIAGNOSIS — Z6825 Body mass index (BMI) 25.0-25.9, adult: Secondary | ICD-10-CM | POA: Diagnosis not present

## 2020-11-04 DIAGNOSIS — T7421XA Adult sexual abuse, confirmed, initial encounter: Secondary | ICD-10-CM | POA: Diagnosis not present

## 2020-11-19 DIAGNOSIS — Z419 Encounter for procedure for purposes other than remedying health state, unspecified: Secondary | ICD-10-CM | POA: Diagnosis not present

## 2020-12-19 DIAGNOSIS — Z419 Encounter for procedure for purposes other than remedying health state, unspecified: Secondary | ICD-10-CM | POA: Diagnosis not present

## 2020-12-22 DIAGNOSIS — R400 Somnolence: Secondary | ICD-10-CM | POA: Diagnosis not present

## 2020-12-22 DIAGNOSIS — Z79899 Other long term (current) drug therapy: Secondary | ICD-10-CM | POA: Diagnosis not present

## 2020-12-22 DIAGNOSIS — R220 Localized swelling, mass and lump, head: Secondary | ICD-10-CM | POA: Diagnosis not present

## 2020-12-22 DIAGNOSIS — F419 Anxiety disorder, unspecified: Secondary | ICD-10-CM | POA: Diagnosis not present

## 2020-12-22 DIAGNOSIS — R519 Headache, unspecified: Secondary | ICD-10-CM | POA: Diagnosis not present

## 2020-12-22 DIAGNOSIS — F319 Bipolar disorder, unspecified: Secondary | ICD-10-CM | POA: Diagnosis not present

## 2020-12-22 DIAGNOSIS — Z880 Allergy status to penicillin: Secondary | ICD-10-CM | POA: Diagnosis not present

## 2020-12-22 DIAGNOSIS — Z885 Allergy status to narcotic agent status: Secondary | ICD-10-CM | POA: Diagnosis not present

## 2020-12-22 DIAGNOSIS — Z6825 Body mass index (BMI) 25.0-25.9, adult: Secondary | ICD-10-CM | POA: Diagnosis not present

## 2020-12-22 DIAGNOSIS — S0990XA Unspecified injury of head, initial encounter: Secondary | ICD-10-CM | POA: Diagnosis not present

## 2020-12-22 DIAGNOSIS — S060X0A Concussion without loss of consciousness, initial encounter: Secondary | ICD-10-CM | POA: Diagnosis not present

## 2020-12-22 DIAGNOSIS — K219 Gastro-esophageal reflux disease without esophagitis: Secondary | ICD-10-CM | POA: Diagnosis not present

## 2020-12-22 DIAGNOSIS — F4310 Post-traumatic stress disorder, unspecified: Secondary | ICD-10-CM | POA: Diagnosis not present

## 2020-12-30 DIAGNOSIS — M25562 Pain in left knee: Secondary | ICD-10-CM | POA: Diagnosis not present

## 2020-12-30 DIAGNOSIS — S060X0D Concussion without loss of consciousness, subsequent encounter: Secondary | ICD-10-CM | POA: Diagnosis not present

## 2020-12-30 DIAGNOSIS — S8992XA Unspecified injury of left lower leg, initial encounter: Secondary | ICD-10-CM | POA: Diagnosis not present

## 2020-12-30 DIAGNOSIS — Z6826 Body mass index (BMI) 26.0-26.9, adult: Secondary | ICD-10-CM | POA: Diagnosis not present

## 2020-12-30 DIAGNOSIS — G8929 Other chronic pain: Secondary | ICD-10-CM | POA: Diagnosis not present

## 2020-12-30 DIAGNOSIS — M25462 Effusion, left knee: Secondary | ICD-10-CM | POA: Diagnosis not present

## 2021-01-19 DIAGNOSIS — Z419 Encounter for procedure for purposes other than remedying health state, unspecified: Secondary | ICD-10-CM | POA: Diagnosis not present

## 2021-01-31 DIAGNOSIS — M25562 Pain in left knee: Secondary | ICD-10-CM | POA: Diagnosis not present

## 2021-01-31 DIAGNOSIS — M79662 Pain in left lower leg: Secondary | ICD-10-CM | POA: Diagnosis not present

## 2021-01-31 DIAGNOSIS — G8929 Other chronic pain: Secondary | ICD-10-CM | POA: Diagnosis not present

## 2021-01-31 DIAGNOSIS — R2242 Localized swelling, mass and lump, left lower limb: Secondary | ICD-10-CM | POA: Diagnosis not present

## 2021-02-10 DIAGNOSIS — G8929 Other chronic pain: Secondary | ICD-10-CM | POA: Diagnosis not present

## 2021-02-10 DIAGNOSIS — M25562 Pain in left knee: Secondary | ICD-10-CM | POA: Diagnosis not present

## 2021-02-10 DIAGNOSIS — R262 Difficulty in walking, not elsewhere classified: Secondary | ICD-10-CM | POA: Diagnosis not present

## 2021-02-19 DIAGNOSIS — Z419 Encounter for procedure for purposes other than remedying health state, unspecified: Secondary | ICD-10-CM | POA: Diagnosis not present

## 2021-03-19 DIAGNOSIS — Z419 Encounter for procedure for purposes other than remedying health state, unspecified: Secondary | ICD-10-CM | POA: Diagnosis not present

## 2021-04-19 DIAGNOSIS — Z419 Encounter for procedure for purposes other than remedying health state, unspecified: Secondary | ICD-10-CM | POA: Diagnosis not present

## 2021-05-19 DIAGNOSIS — Z419 Encounter for procedure for purposes other than remedying health state, unspecified: Secondary | ICD-10-CM | POA: Diagnosis not present

## 2021-06-19 DIAGNOSIS — Z419 Encounter for procedure for purposes other than remedying health state, unspecified: Secondary | ICD-10-CM | POA: Diagnosis not present

## 2021-07-19 DIAGNOSIS — Z419 Encounter for procedure for purposes other than remedying health state, unspecified: Secondary | ICD-10-CM | POA: Diagnosis not present

## 2021-08-19 DIAGNOSIS — Z419 Encounter for procedure for purposes other than remedying health state, unspecified: Secondary | ICD-10-CM | POA: Diagnosis not present

## 2021-09-10 DIAGNOSIS — G8929 Other chronic pain: Secondary | ICD-10-CM | POA: Diagnosis not present

## 2021-09-10 DIAGNOSIS — K219 Gastro-esophageal reflux disease without esophagitis: Secondary | ICD-10-CM | POA: Diagnosis not present

## 2021-09-10 DIAGNOSIS — F4310 Post-traumatic stress disorder, unspecified: Secondary | ICD-10-CM | POA: Diagnosis not present

## 2021-09-10 DIAGNOSIS — F3177 Bipolar disorder, in partial remission, most recent episode mixed: Secondary | ICD-10-CM | POA: Diagnosis not present

## 2021-09-10 DIAGNOSIS — Z6826 Body mass index (BMI) 26.0-26.9, adult: Secondary | ICD-10-CM | POA: Diagnosis not present

## 2021-09-10 DIAGNOSIS — M79672 Pain in left foot: Secondary | ICD-10-CM | POA: Diagnosis not present

## 2021-09-10 DIAGNOSIS — M25562 Pain in left knee: Secondary | ICD-10-CM | POA: Diagnosis not present

## 2021-09-19 DIAGNOSIS — Z419 Encounter for procedure for purposes other than remedying health state, unspecified: Secondary | ICD-10-CM | POA: Diagnosis not present

## 2021-10-19 DIAGNOSIS — Z419 Encounter for procedure for purposes other than remedying health state, unspecified: Secondary | ICD-10-CM | POA: Diagnosis not present

## 2021-11-19 DIAGNOSIS — Z419 Encounter for procedure for purposes other than remedying health state, unspecified: Secondary | ICD-10-CM | POA: Diagnosis not present

## 2021-12-19 DIAGNOSIS — Z419 Encounter for procedure for purposes other than remedying health state, unspecified: Secondary | ICD-10-CM | POA: Diagnosis not present

## 2022-01-19 DIAGNOSIS — Z419 Encounter for procedure for purposes other than remedying health state, unspecified: Secondary | ICD-10-CM | POA: Diagnosis not present

## 2022-02-19 DIAGNOSIS — Z419 Encounter for procedure for purposes other than remedying health state, unspecified: Secondary | ICD-10-CM | POA: Diagnosis not present

## 2022-03-20 DIAGNOSIS — Z419 Encounter for procedure for purposes other than remedying health state, unspecified: Secondary | ICD-10-CM | POA: Diagnosis not present

## 2022-04-20 DIAGNOSIS — Z419 Encounter for procedure for purposes other than remedying health state, unspecified: Secondary | ICD-10-CM | POA: Diagnosis not present

## 2022-05-20 DIAGNOSIS — Z419 Encounter for procedure for purposes other than remedying health state, unspecified: Secondary | ICD-10-CM | POA: Diagnosis not present

## 2022-06-19 DIAGNOSIS — F411 Generalized anxiety disorder: Secondary | ICD-10-CM | POA: Diagnosis not present

## 2022-07-02 DIAGNOSIS — F3177 Bipolar disorder, in partial remission, most recent episode mixed: Secondary | ICD-10-CM | POA: Diagnosis not present
